# Patient Record
Sex: Male | Born: 1940 | Race: White | Hispanic: No | Marital: Married | Smoking: Current every day smoker
Health system: Southern US, Community
[De-identification: ages and names within clinical notes are randomized; demographics above are authoritative.]

## PROBLEM LIST (undated history)

## (undated) DIAGNOSIS — C679 Malignant neoplasm of bladder, unspecified: Secondary | ICD-10-CM

## (undated) DIAGNOSIS — J45909 Unspecified asthma, uncomplicated: Secondary | ICD-10-CM

## (undated) DIAGNOSIS — N403 Nodular prostate with lower urinary tract symptoms: Secondary | ICD-10-CM

## (undated) DIAGNOSIS — K219 Gastro-esophageal reflux disease without esophagitis: Secondary | ICD-10-CM

## (undated) DIAGNOSIS — C672 Malignant neoplasm of lateral wall of bladder: Secondary | ICD-10-CM

## (undated) DIAGNOSIS — G459 Transient cerebral ischemic attack, unspecified: Secondary | ICD-10-CM

## (undated) DIAGNOSIS — J439 Emphysema, unspecified: Secondary | ICD-10-CM

## (undated) DIAGNOSIS — G939 Disorder of brain, unspecified: Secondary | ICD-10-CM

## (undated) DIAGNOSIS — R338 Other retention of urine: Secondary | ICD-10-CM

## (undated) DIAGNOSIS — J449 Chronic obstructive pulmonary disease, unspecified: Secondary | ICD-10-CM

## (undated) DIAGNOSIS — E0590 Thyrotoxicosis, unspecified without thyrotoxic crisis or storm: Secondary | ICD-10-CM

## (undated) DIAGNOSIS — D494 Neoplasm of unspecified behavior of bladder: Secondary | ICD-10-CM

## (undated) DIAGNOSIS — I10 Essential (primary) hypertension: Secondary | ICD-10-CM

## (undated) HISTORY — DX: Essential (primary) hypertension: I10

## (undated) HISTORY — PX: BACK SURGERY: SHX140

## (undated) HISTORY — DX: Disorder of brain, unspecified: G93.9

## (undated) HISTORY — DX: Malignant neoplasm of lateral wall of bladder: C67.2

## (undated) HISTORY — DX: Chronic obstructive pulmonary disease, unspecified: J44.9

## (undated) HISTORY — DX: Nodular prostate with lower urinary tract symptoms: N40.3

## (undated) HISTORY — DX: Transient cerebral ischemic attack, unspecified: G45.9

## (undated) HISTORY — DX: Gastro-esophageal reflux disease without esophagitis: K21.9

## (undated) HISTORY — DX: Malignant neoplasm of bladder, unspecified: C67.9

## (undated) HISTORY — DX: Thyrotoxicosis, unspecified without thyrotoxic crisis or storm: E05.90

## (undated) HISTORY — DX: Unspecified asthma, uncomplicated: J45.909

## (undated) HISTORY — DX: Other retention of urine: R33.8

## (undated) HISTORY — DX: Emphysema, unspecified: J43.9

## (undated) SURGERY — BRONCHOSCOPY, FLEXIBLE
Anesthesia: IV Sedation (MBSC Only) | Laterality: Left

---

## 2004-08-02 ENCOUNTER — Ambulatory Visit: Payer: Self-pay | Admitting: Family Medicine

## 2005-04-11 ENCOUNTER — Ambulatory Visit: Payer: Self-pay | Admitting: Internal Medicine

## 2007-09-28 ENCOUNTER — Ambulatory Visit: Payer: Self-pay | Admitting: Unknown Physician Specialty

## 2007-10-07 ENCOUNTER — Ambulatory Visit: Payer: Self-pay | Admitting: Unknown Physician Specialty

## 2008-08-22 ENCOUNTER — Emergency Department (HOSPITAL_COMMUNITY): Admission: EM | Admit: 2008-08-22 | Discharge: 2008-08-22 | Payer: Self-pay | Admitting: Emergency Medicine

## 2009-08-07 ENCOUNTER — Ambulatory Visit: Payer: Self-pay | Admitting: Unknown Physician Specialty

## 2010-04-26 ENCOUNTER — Emergency Department (HOSPITAL_COMMUNITY)
Admission: EM | Admit: 2010-04-26 | Discharge: 2010-04-27 | Payer: Self-pay | Source: Home / Self Care | Admitting: Emergency Medicine

## 2010-04-28 HISTORY — PX: BLADDER SURGERY: SHX569

## 2010-07-08 ENCOUNTER — Ambulatory Visit: Payer: Self-pay

## 2010-07-08 LAB — BASIC METABOLIC PANEL
CO2: 28 mEq/L (ref 19–32)
Calcium: 9.9 mg/dL (ref 8.4–10.5)
Creatinine, Ser: 1.35 mg/dL (ref 0.4–1.5)
GFR calc Af Amer: >60 mL/min (ref >60)
GFR calc non Af Amer: 52 mL/min — ABNORMAL LOW (ref >60)
Sodium: 136 mEq/L (ref 135–145)

## 2010-07-08 LAB — DIFFERENTIAL
Basophils Relative: 2 % — ABNORMAL HIGH (ref 0–1)
Eosinophils Relative: 2 % (ref 0–5)
Lymphocytes Relative: 18 % (ref 12–46)
Lymphs Abs: 1.7 10*3/uL (ref 0.7–4.0)
Myelocytes: 0 %
Neutro Abs: 6.3 10*3/uL (ref 1.7–7.7)
Neutrophils Relative %: 66 % (ref 43–77)
Promyelocytes Absolute: 0 %
nRBC: 0 /100 WBC

## 2010-07-08 LAB — CBC
Hemoglobin: 12.9 g/dL — ABNORMAL LOW (ref 13.0–17.0)
MCH: 31.9 pg (ref 26.0–34.0)
Platelets: 252 10*3/uL (ref 150–400)
RBC: 4.05 MIL/uL — ABNORMAL LOW (ref 4.22–5.81)

## 2010-09-05 ENCOUNTER — Ambulatory Visit: Payer: Self-pay | Admitting: Urology

## 2010-09-09 ENCOUNTER — Ambulatory Visit: Payer: Self-pay | Admitting: Urology

## 2011-04-15 ENCOUNTER — Ambulatory Visit: Payer: Self-pay | Admitting: Urology

## 2011-04-16 LAB — PATHOLOGY REPORT

## 2012-01-12 ENCOUNTER — Ambulatory Visit: Payer: Self-pay | Admitting: Family Medicine

## 2012-03-17 DIAGNOSIS — C679 Malignant neoplasm of bladder, unspecified: Secondary | ICD-10-CM | POA: Insufficient documentation

## 2012-03-17 DIAGNOSIS — C672 Malignant neoplasm of lateral wall of bladder: Secondary | ICD-10-CM | POA: Insufficient documentation

## 2012-03-17 DIAGNOSIS — R339 Retention of urine, unspecified: Secondary | ICD-10-CM | POA: Insufficient documentation

## 2013-12-14 DIAGNOSIS — N183 Chronic kidney disease, stage 3 unspecified: Secondary | ICD-10-CM | POA: Insufficient documentation

## 2013-12-23 ENCOUNTER — Ambulatory Visit: Payer: Self-pay | Admitting: Vascular Surgery

## 2014-07-10 ENCOUNTER — Ambulatory Visit: Payer: Self-pay | Admitting: Urology

## 2014-07-17 ENCOUNTER — Ambulatory Visit: Payer: Self-pay | Admitting: Urology

## 2014-07-29 ENCOUNTER — Other Ambulatory Visit: Payer: Self-pay

## 2014-07-29 DIAGNOSIS — I6782 Cerebral ischemia: Secondary | ICD-10-CM | POA: Insufficient documentation

## 2014-07-29 DIAGNOSIS — C679 Malignant neoplasm of bladder, unspecified: Secondary | ICD-10-CM | POA: Insufficient documentation

## 2014-07-29 DIAGNOSIS — N401 Enlarged prostate with lower urinary tract symptoms: Secondary | ICD-10-CM

## 2014-07-29 DIAGNOSIS — N138 Other obstructive and reflux uropathy: Secondary | ICD-10-CM | POA: Insufficient documentation

## 2014-07-29 DIAGNOSIS — M1990 Unspecified osteoarthritis, unspecified site: Secondary | ICD-10-CM | POA: Insufficient documentation

## 2014-07-29 DIAGNOSIS — J45909 Unspecified asthma, uncomplicated: Secondary | ICD-10-CM | POA: Insufficient documentation

## 2014-07-29 DIAGNOSIS — K219 Gastro-esophageal reflux disease without esophagitis: Secondary | ICD-10-CM | POA: Insufficient documentation

## 2014-07-29 DIAGNOSIS — C672 Malignant neoplasm of lateral wall of bladder: Secondary | ICD-10-CM | POA: Insufficient documentation

## 2014-07-29 DIAGNOSIS — G939 Disorder of brain, unspecified: Secondary | ICD-10-CM | POA: Insufficient documentation

## 2014-07-29 DIAGNOSIS — E0590 Thyrotoxicosis, unspecified without thyrotoxic crisis or storm: Secondary | ICD-10-CM | POA: Insufficient documentation

## 2014-07-29 DIAGNOSIS — E785 Hyperlipidemia, unspecified: Secondary | ICD-10-CM | POA: Insufficient documentation

## 2014-07-29 HISTORY — DX: Cerebral ischemia: I67.82

## 2014-07-29 HISTORY — DX: Unspecified asthma, uncomplicated: J45.909

## 2014-08-21 LAB — SURGICAL PATHOLOGY

## 2014-08-27 NOTE — Op Note (Signed)
PATIENT NAME:  Bruce May, Bruce May MR#:  [id number] DATE OF BIRTH:  [birth year]  PREOPERATIVE DIAGNOSIS: Recurrent bladder tumor.   POSTOPERATIVE DIAGNOSIS: Recurrent bladder tumor.   PROCEDURE: Cystoscopy with biopsy of small bladder tumor with fulguration.   ANESTHESIA:  General.  ANESTHESIA: Timeout taken preoperatively pre-instrumentation.  With the patient sterilely prepped and draped in supine lithotomy position and after an appropriate timeout, we began the procedure.  He has had preop antibiotics in the form of Cipro. He had good relaxation from general anesthetic. Cystoscopy revealed a normal urethra throughout its length with possibly 1 large bulbous urethral stricture that is easily bypassed with a 22 French sheath. Prostate is mildly enlarged first in the middle lobe. On view of the bladder, there was minimal trabeculation, no saccule, no stones. There was a small tiny probably recurrent tumor on the left posterolateral wall which is biopsied singly with flexible biopsy forceps, fulgurated with a 5 [place] Bugbee electrode. No bleeding at the end of the procedure. Rectal exam for placement of the B and O suppository is negative for tumor, mass, or growth. The bladder itself has 30 mL of 0.5% Marcaine placed in it. There is no bleeding at the end of the procedure. He is sent to recovery in satisfactory condition.    ____________________________ Janice Coffin. Elnoria Howard, Tangent rdh:sp D: 07/17/2014 13:53:21 ET T: 07/17/2014 14:05:11 ET JOB#: [id number]  cc: Janice Coffin. Elnoria Howard, DO, <Dictator> Shawntee Mainwaring D Bonnye Halle DO ELECTRONICALLY SIGNED 08/07/2014 12:57

## 2014-09-12 ENCOUNTER — Encounter: Payer: Self-pay | Admitting: *Deleted

## 2014-09-28 ENCOUNTER — Ambulatory Visit (INDEPENDENT_AMBULATORY_CARE_PROVIDER_SITE_OTHER): Payer: Medicare Other

## 2014-09-28 VITALS — BP 164/76 | HR 73 | Ht 68.0 in | Wt 153.7 lb

## 2014-09-28 DIAGNOSIS — C672 Malignant neoplasm of lateral wall of bladder: Secondary | ICD-10-CM

## 2014-09-28 LAB — URINALYSIS, COMPLETE
BILIRUBIN UA: NEGATIVE
GLUCOSE, UA: NEGATIVE
KETONES UA: NEGATIVE
LEUKOCYTES UA: NEGATIVE
NITRITE UA: NEGATIVE
Specific Gravity, UA: 1.025 (ref 1.005–1.030)
Urobilinogen, Ur: 1 mg/dL (ref 0.2–1.0)
pH, UA: 5.5 (ref 5.0–7.5)

## 2014-09-28 LAB — MICROSCOPIC EXAMINATION: BACTERIA UA: NONE SEEN

## 2014-09-28 MED ORDER — BCG LIVE 81 MG/VIAL IS SUSR: 81.0000 mg | Freq: Once | INTRAVESICAL | Status: DC

## 2014-09-28 MED ORDER — LIDOCAINE HCL 2 % EX GEL
1.0000 "application " | Freq: Once | CUTANEOUS | Status: AC
  Administered 2014-09-28: 1 via URETHRAL

## 2014-09-28 NOTE — Progress Notes (Signed)
BCG Bladder Instillation  BCG # 5  Due to Bladder Cancer patient is present today for a BCG treatment. Patient was cleaned and prepped in a sterile fashion with betadine and lidocaine 2% jelly was instilled into the urethra.  A 14FR catheter was inserted, urine return was noted 61m, urine was yellow in color.  530mof reconstituted BCG was instilled into the bladder. The catheter was then removed. Patient tolerated well, no complications were noted  Preformed by: ChToniann FailLPN  Follow up/ Additional notes: f/u 1 week for final BCG

## 2014-10-02 MED ORDER — BCG LIVE 81 MG/VIAL IS SUSR: 81.0000 mg | Freq: Once | INTRAVESICAL | Status: DC

## 2014-10-04 ENCOUNTER — Ambulatory Visit: Payer: Self-pay | Admitting: Urology

## 2014-10-05 ENCOUNTER — Encounter: Payer: Self-pay | Admitting: Urology

## 2014-10-05 ENCOUNTER — Ambulatory Visit (INDEPENDENT_AMBULATORY_CARE_PROVIDER_SITE_OTHER): Payer: Medicare Other | Admitting: Urology

## 2014-10-05 VITALS — BP 163/78 | HR 81 | Ht 68.0 in | Wt 153.3 lb

## 2014-10-05 DIAGNOSIS — D090 Carcinoma in situ of bladder: Secondary | ICD-10-CM

## 2014-10-05 LAB — URINALYSIS, COMPLETE
BILIRUBIN UA: NEGATIVE
GLUCOSE, UA: NEGATIVE
Ketones, UA: NEGATIVE
LEUKOCYTES UA: NEGATIVE
Nitrite, UA: NEGATIVE
RBC UA: NEGATIVE
SPEC GRAV UA: 1.025 (ref 1.005–1.030)
UUROB: 0.2 mg/dL (ref 0.2–1.0)
pH, UA: 5 (ref 5.0–7.5)

## 2014-10-05 LAB — MICROSCOPIC EXAMINATION: BACTERIA UA: NONE SEEN

## 2014-10-05 MED ORDER — LIDOCAINE HCL 2 % EX GEL
1.0000 "application " | Freq: Once | CUTANEOUS | Status: AC
  Administered 2014-10-05: 1 via URETHRAL

## 2014-10-05 MED ORDER — SODIUM CHLORIDE 0.9 % IJ SOLN
50.0000 mL | Freq: Once | INTRAMUSCULAR | Status: AC
  Administered 2014-10-05: 50 mL

## 2014-10-05 MED ORDER — BCG LIVE 50 MG IS SUSR: 3.2400 mL | Freq: Once | INTRAVESICAL | Status: DC

## 2014-10-05 MED ORDER — BCG LIVE 50 MG IS SUSR
3.2400 mL | Freq: Once | INTRAVESICAL | Status: AC
  Administered 2014-10-05: 81 mg via INTRAVESICAL

## 2014-10-05 MED ORDER — SODIUM CHLORIDE 0.9 % IJ SOLN: 50.0000 mL | Freq: Once | INTRAMUSCULAR | Status: DC

## 2014-10-05 NOTE — Progress Notes (Signed)
BCG Bladder Instillation  BCG # 6  Due to Bladder Cancer patient is present today for a BCG treatment. Patient was cleaned and prepped in a sterile fashion with betadine and lidocaine 2% jelly was instilled into the urethra.  A 16FR coude catheter was inserted, urine return was noted 46m, urine was Lt. yellow in color.  532mof reconstituted BCG was instilled into the bladder. The catheter was then removed. Patient tolerated well, no complications were noted.Preformed by: RaLyndee HensenMA  Follow up/ Additional notes: 3 months for cystoscopy .

## 2014-10-07 NOTE — Progress Notes (Signed)
10/05/2014 9:05 PM   Bruce May 21-Aug-1940 [id number]  Referring provider: No referring provider defined for this encounter.  Chief Complaint  Patient presents with  . Bladder Cancer    6th BCG treatment    HPI: Bruce May is a 74 year old white male who presents today for his 6th BCG treatment. His UA is unremarkable today. He did not have any difficulty with his last instillation. He was able to hold it for the full 2 hours. He denied any hematuria after the treatment.  He denies any fevers, chills, nausea, vomiting or cough. He has no urinary symptoms at this time.  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 Jacqlyn May.  Maintenance cysto 06/29/14 by Dr. Elnoria May revealed small suspicious area. TURBT was scheduled. Pathology report below.  Surgical Pathology Report DIAGNOSIS: A. BLADDER; BIOPSY: - UROTHELIAL CARCINOMA IN SITU. GROSS DESCRIPTION: A. Labeled: Recurrent bladder tumor Tissue Fragment(s): 1 Measurement: 0.3 cm Comment: Pink, marked blue 07/19/2014 2:54:46PM     PMH: Past Medical History  Diagnosis Date  . Malignant neoplasm of bladder   . Hyperthyroidism   . Nodular prostate with urinary retention   . TIA (transient ischemic attack)   . Acid reflux   . Asthma   . Hypertension   . Malignant neoplasm of lateral wall of urinary bladder     Surgical History: Past Surgical History  Procedure Laterality Date  . Bladder surgery  2012  . Back surgery      Home Medications:    Medication List       This list is accurate as of: 10/05/14 11:59 PM.  Always use your most recent med list.               albuterol 108 (90 BASE) MCG/ACT inhaler  Commonly known as:  PROVENTIL HFA;VENTOLIN HFA  Inhale into the lungs.     aspirin 120 MG suppository  Take by mouth.     aspirin 325 MG EC tablet  Take 325 mg by mouth daily.     calcium carbonate 1250 MG capsule  Take by mouth.     cetirizine 10 MG tablet  Commonly known as:   ZYRTEC  Take 10 mg by mouth daily.     fenofibrate 160 MG tablet  Take by mouth.     finasteride 5 MG tablet  Commonly known as:  PROSCAR  Take by mouth.     FISH OIL EXTRA STRENGTH 1200 MG Caps  Take by mouth.     Fluticasone-Salmeterol 250-50 MCG/DOSE Aepb  Commonly known as:  ADVAIR  Inhale into the lungs.     levothyroxine 112 MCG tablet  Commonly known as:  SYNTHROID, LEVOTHROID  Take by mouth.     MULTI VITAMIN DAILY Tabs  Take by mouth.     omeprazole 40 MG capsule  Commonly known as:  PRILOSEC  Take by mouth.     pravastatin 80 MG tablet  Commonly known as:  PRAVACHOL  Take by mouth.     tiotropium 18 MCG inhalation capsule  Commonly known as:  Crisman into inhaler and inhale.        Allergies: No Known Allergies  Family History: Family History  Problem Relation Age of Onset  . Kidney disease Sister   . Diabetes Sister     Social History:  reports that he has been smoking.  He does not have any smokeless tobacco history on file. He reports that he does not drink alcohol  or use illicit drugs.  ROS: Urological Symptom Review  Patient is experiencing the following symptoms: Frequent urination Get up at night to urinate   Review of Systems  Gastrointestinal (upper)  : Indigestion/heartburn  Gastrointestinal (lower) : Negative for lower GI symptoms  Constitutional : Negative for symptoms  Skin: Negative for skin symptoms  Eyes: Negative for eye symptoms  Ear/Nose/Throat : Negative for Ear/Nose/Throat symptoms  Hematologic/Lymphatic: Negative for Hematologic/Lymphatic symptoms  Cardiovascular : Negative for cardiovascular symptoms  Respiratory : Cough  Endocrine: Negative for endocrine symptoms  Musculoskeletal: Back pain  Neurological: Negative for neurological symptoms  Psychologic: Negative for psychiatric symptoms   Physical Exam: BP 163/78 mmHg  Pulse 81  Ht '5\' 8"'$  (1.727 m)  Wt 153 lb 4.8 oz (69.536  kg)  BMI 23.31 kg/m2   Laboratory Data: Lab Results  Component Value Date   WBC 9.5 04/27/2010   HGB 12.9* 04/27/2010   HCT 36.8* 04/27/2010   MCV 90.9 04/27/2010   PLT 252 04/27/2010    Lab Results  Component Value Date   CREATININE 1.35 04/27/2010    No results found for: PSA  No results found for: TESTOSTERONE  No results found for: HGBA1C  Urinalysis Results for orders placed or performed in visit on 10/05/14  Microscopic Examination  Result Value Ref Range   WBC, UA 0-5 0 -  5 /hpf   RBC, UA 0-2 0 -  2 /hpf   Epithelial Cells (non renal) 0-10 0 - 10 /hpf   Bacteria, UA None seen None seen/Few  Urinalysis, Complete  Result Value Ref Range   Specific Gravity, UA 1.025 1.005 - 1.030   pH, UA 5.0 5.0 - 7.5   Color, UA Yellow Yellow   Appearance Ur Clear Clear   Leukocytes, UA Negative Negative   Protein, UA 2+ (A) Negative/Trace   Glucose, UA Negative Negative   Ketones, UA Negative Negative   RBC, UA Negative Negative   Bilirubin, UA Negative Negative   Urobilinogen, Ur 0.2 0.2 - 1.0 mg/dL   Nitrite, UA Negative Negative   Microscopic Examination See below:        Component Value Date/Time   GLUCOSEU Negative 10/05/2014 0923   BILIRUBINUR Negative 10/05/2014 0923   NITRITE Negative 10/05/2014 0923   LEUKOCYTESUR Negative 10/05/2014 0923    Pertinent Imaging:   Assessment & Plan:    1. CIS of bladder-  Patient has completed his sixth BCG today. He will return in 3 months time for surveillance cystoscopy.  - Urinalysis, Complete - lidocaine (XYLOCAINE) 2 % jelly 1 application; Place 1 application into the urethra once. - bcg vaccine injection 81 mg; Instill 3.24 mLs (81 mg total) into the bladder once. - sodium chloride 0.9 % injection 50 mL; 50 mLs by Intracatheter route once.   Return in about 3 months (around 01/05/2015) for Cystoscopy.  Bruce May, [facility] [address], [place], [place]  [address] [phone]

## 2014-10-11 DIAGNOSIS — J449 Chronic obstructive pulmonary disease, unspecified: Secondary | ICD-10-CM | POA: Insufficient documentation

## 2014-10-11 DIAGNOSIS — R030 Elevated blood-pressure reading, without diagnosis of hypertension: Secondary | ICD-10-CM

## 2014-10-11 DIAGNOSIS — IMO0001 Reserved for inherently not codable concepts without codable children: Secondary | ICD-10-CM | POA: Insufficient documentation

## 2014-10-11 DIAGNOSIS — L989 Disorder of the skin and subcutaneous tissue, unspecified: Secondary | ICD-10-CM | POA: Insufficient documentation

## 2014-10-11 DIAGNOSIS — N400 Enlarged prostate without lower urinary tract symptoms: Secondary | ICD-10-CM | POA: Insufficient documentation

## 2014-10-11 DIAGNOSIS — Z8551 Personal history of malignant neoplasm of bladder: Secondary | ICD-10-CM | POA: Insufficient documentation

## 2014-10-11 DIAGNOSIS — R5383 Other fatigue: Secondary | ICD-10-CM | POA: Insufficient documentation

## 2014-10-11 DIAGNOSIS — Z8673 Personal history of transient ischemic attack (TIA), and cerebral infarction without residual deficits: Secondary | ICD-10-CM | POA: Insufficient documentation

## 2014-10-11 DIAGNOSIS — E039 Hypothyroidism, unspecified: Secondary | ICD-10-CM | POA: Insufficient documentation

## 2014-10-11 DIAGNOSIS — R0989 Other specified symptoms and signs involving the circulatory and respiratory systems: Secondary | ICD-10-CM | POA: Insufficient documentation

## 2014-10-11 DIAGNOSIS — J439 Emphysema, unspecified: Secondary | ICD-10-CM

## 2014-10-11 DIAGNOSIS — E7800 Pure hypercholesterolemia, unspecified: Secondary | ICD-10-CM | POA: Insufficient documentation

## 2014-10-11 HISTORY — DX: Chronic obstructive pulmonary disease, unspecified: J44.9

## 2014-10-11 HISTORY — DX: Emphysema, unspecified: J43.9

## 2014-12-07 ENCOUNTER — Ambulatory Visit (INDEPENDENT_AMBULATORY_CARE_PROVIDER_SITE_OTHER): Payer: Medicare Other | Admitting: Family Medicine

## 2014-12-07 ENCOUNTER — Encounter: Payer: Self-pay | Admitting: Family Medicine

## 2014-12-07 ENCOUNTER — Other Ambulatory Visit: Payer: Self-pay

## 2014-12-07 VITALS — BP 140/78 | HR 76 | Temp 97.9°F | Resp 16 | Wt 151.6 lb

## 2014-12-07 DIAGNOSIS — R0989 Other specified symptoms and signs involving the circulatory and respiratory systems: Secondary | ICD-10-CM | POA: Diagnosis not present

## 2014-12-07 DIAGNOSIS — N183 Chronic kidney disease, stage 3 unspecified: Secondary | ICD-10-CM

## 2014-12-07 DIAGNOSIS — E7800 Pure hypercholesterolemia, unspecified: Secondary | ICD-10-CM

## 2014-12-07 DIAGNOSIS — E786 Lipoprotein deficiency: Secondary | ICD-10-CM | POA: Diagnosis not present

## 2014-12-07 DIAGNOSIS — R030 Elevated blood-pressure reading, without diagnosis of hypertension: Secondary | ICD-10-CM

## 2014-12-07 DIAGNOSIS — N400 Enlarged prostate without lower urinary tract symptoms: Secondary | ICD-10-CM

## 2014-12-07 DIAGNOSIS — IMO0001 Reserved for inherently not codable concepts without codable children: Secondary | ICD-10-CM

## 2014-12-07 DIAGNOSIS — E039 Hypothyroidism, unspecified: Secondary | ICD-10-CM | POA: Insufficient documentation

## 2014-12-07 DIAGNOSIS — E038 Other specified hypothyroidism: Secondary | ICD-10-CM | POA: Diagnosis not present

## 2014-12-07 MED ORDER — FINASTERIDE 5 MG PO TABS: 5.0000 mg | ORAL_TABLET | Freq: Every day | ORAL | Status: AC

## 2014-12-07 NOTE — Progress Notes (Signed)
Patient: Bruce May Male    DOB: [birth year]   74 y.o.   MRN: [id number] Visit Date: 12/07/2014  Today's Provider: Vernie Murders, PA   Chief Complaint  Patient presents with  . Follow-up    Hypothyrodism, Elevated blood pressure, hypercholesteromia   Subjective:    Hypertension Pertinent negatives include no chest pain, palpitations or shortness of breath.  Hyperlipidemia Pertinent negatives include no chest pain or shortness of breath.   Past Medical History  Diagnosis Date  . Malignant neoplasm of bladder   . Hyperthyroidism   . Nodular prostate with urinary retention   . TIA (transient ischemic attack)   . Acid reflux   . Asthma   . Hypertension   . Malignant neoplasm of lateral wall of urinary bladder    Past Surgical History  Procedure Laterality Date  . Bladder surgery  2012  . Back surgery     Family History  Problem Relation Age of Onset  . Kidney disease Sister   . Diabetes Sister      No Known Allergies Previous Medications   ALBUTEROL (PROVENTIL HFA;VENTOLIN HFA) 108 (90 BASE) MCG/ACT INHALER    Inhale into the lungs.   ASPIRIN 325 MG EC TABLET    Take 325 mg by mouth daily.   CALCIUM CARBONATE 1250 MG CAPSULE    Take by mouth.   CETIRIZINE (ZYRTEC) 10 MG TABLET    Take 10 mg by mouth daily.   FENOFIBRATE 160 MG TABLET    Take by mouth.   FINASTERIDE (PROSCAR) 5 MG TABLET    Take by mouth.   FLUTICASONE-SALMETEROL (ADVAIR) 250-50 MCG/DOSE AEPB    Inhale into the lungs.   LEVOTHYROXINE (SYNTHROID, LEVOTHROID) 112 MCG TABLET    Take by mouth.   MULTIPLE VITAMIN (MULTI VITAMIN DAILY) TABS    Take by mouth.   OMEGA-3 FATTY ACIDS (FISH OIL EXTRA STRENGTH) 1200 MG CAPS    Take by mouth.   OMEPRAZOLE (PRILOSEC) 40 MG CAPSULE    Take by mouth.   PRAVASTATIN (PRAVACHOL) 80 MG TABLET    Take by mouth.   TIOTROPIUM (SPIRIVA) 18 MCG INHALATION CAPSULE    Place into inhaler and inhale.    Review of Systems  Constitutional: Negative.  Negative for  fatigue.  HENT: Negative.   Eyes: Negative.   Respiratory: Positive for wheezing. Negative for shortness of breath.   Cardiovascular: Negative.  Negative for chest pain and palpitations.  Gastrointestinal: Negative.   Endocrine: Negative.   Genitourinary: Negative.   Musculoskeletal: Negative.   Skin: Negative.   Allergic/Immunologic: Negative.   Neurological: Negative.   Hematological: Negative.   Psychiatric/Behavioral: Negative.     Social History  Substance Use Topics  . Smoking status: Current Every Day Smoker  . Smokeless tobacco: Not on file  . Alcohol Use: No   Objective:   BP 140/78 mmHg  Pulse 76  Temp(Src) 97.9 F (36.6 C) (Oral)  Resp 16  Wt 151 lb 9.6 oz (68.765 kg)  Physical Exam  Constitutional: He is oriented to person, place, and time. He appears well-developed and well-nourished. No distress.  HENT:  Head: Normocephalic and atraumatic.  Right Ear: Hearing normal.  Left Ear: Hearing normal.  Nose: Nose normal.  Mouth/Throat: Oropharynx is clear and moist.  Wearing hearing aids bilaterally. TM's with good reflex and no cerumen accumulation.  Eyes: Conjunctivae, EOM and lids are normal. Right eye exhibits no discharge. Left eye exhibits no discharge. No scleral icterus.  Neck: Normal range of motion. Neck supple.  Cardiovascular: Normal rate and regular rhythm.   Right carotid bruit followed by vascular specialist (Dr. Delana Meyer annually).  Pulmonary/Chest: Effort normal. No respiratory distress.  Distant breath sounds of COPD  Abdominal: Soft. Bowel sounds are normal.  Musculoskeletal: Normal range of motion.  Neurological: He is alert and oriented to person, place, and time.  Skin: Skin is intact. No lesion and no rash noted.  Psychiatric: He has a normal mood and affect. His speech is normal and behavior is normal. Thought content normal.      Assessment & Plan:     1. Blood pressure elevated Stable and to adversely elevated today. Will recheck  routine labs. - CBC with Differential/Platelet  2. Hypercholesterolemia without hypertriglyceridemia Tolerating Pravastatin and Omega-3 supplement without side effects. Will recheck lipids. - Lipid panel  3. Other specified hypothyroidism Still some decrease in energy in the mornings but better as he works throughout the day. Still on Levothyroxine 100 mcg qd. Will recheck TSH. - TSH  4. Chronic kidney disease (CKD), stage III (moderate) Followed by nephrologist regularly. Recheck CMP. - Comprehensive metabolic panel  5. Low HDL (under 40) Triglycerides and LDL was in good shape in April 2016. Continue present medications and recheck lipids.  6. Bruit of right carotid artery Followed annually by Dr. Delana Meyer (vascular specialist). Pulmonologist, vascular specialist and this office constantly counsels him on stopping his smoking > 1 ppd. Still refuses despite bruit and COPD.       Vernie Murders, PA  [facility]

## 2014-12-08 LAB — LIPID PANEL
CHOL/HDL RATIO: 5.1 ratio — AB (ref 0.0–5.0)
Cholesterol, Total: 154 mg/dL (ref 100–199)
HDL: 30 mg/dL — AB (ref >39)
LDL Calculated: 97 mg/dL (ref 0–99)
Triglycerides: 134 mg/dL (ref 0–149)
VLDL CHOLESTEROL CAL: 27 mg/dL (ref 5–40)

## 2014-12-08 LAB — CBC WITH DIFFERENTIAL/PLATELET
BASOS: 1 %
Basophils Absolute: 0 10*3/uL (ref 0.0–0.2)
EOS (ABSOLUTE): 0.3 10*3/uL (ref 0.0–0.4)
EOS: 4 %
HEMOGLOBIN: 12.4 g/dL — AB (ref 12.6–17.7)
Hematocrit: 37.6 % (ref 37.5–51.0)
IMMATURE GRANS (ABS): 0 10*3/uL (ref 0.0–0.1)
IMMATURE GRANULOCYTES: 0 %
Lymphocytes Absolute: 2.3 10*3/uL (ref 0.7–3.1)
Lymphs: 33 %
MCH: 30.7 pg (ref 26.6–33.0)
MCHC: 33 g/dL (ref 31.5–35.7)
MCV: 93 fL (ref 79–97)
Monocytes Absolute: 0.7 10*3/uL (ref 0.1–0.9)
Monocytes: 10 %
NEUTROS PCT: 52 %
Neutrophils Absolute: 3.5 10*3/uL (ref 1.4–7.0)
Platelets: 323 10*3/uL (ref 150–379)
RBC: 4.04 x10E6/uL — AB (ref 4.14–5.80)
RDW: 13.4 % (ref 12.3–15.4)
WBC: 6.8 10*3/uL (ref 3.4–10.8)

## 2014-12-08 LAB — COMPREHENSIVE METABOLIC PANEL
A/G RATIO: 1.8 (ref 1.1–2.5)
ALT: 14 IU/L (ref 0–44)
AST: 27 IU/L (ref 0–40)
Albumin: 4.2 g/dL (ref 3.5–4.8)
Alkaline Phosphatase: 48 IU/L (ref 39–117)
BUN / CREAT RATIO: 20 (ref 10–22)
BUN: 25 mg/dL (ref 8–27)
Bilirubin Total: 0.5 mg/dL (ref 0.0–1.2)
CHLORIDE: 103 mmol/L (ref 97–108)
CO2: 22 mmol/L (ref 18–29)
Calcium: 9.1 mg/dL (ref 8.6–10.2)
Creatinine, Ser: 1.25 mg/dL (ref 0.76–1.27)
GFR calc non Af Amer: 56 mL/min/{1.73_m2} — ABNORMAL LOW (ref >59)
GFR, EST AFRICAN AMERICAN: 65 mL/min/{1.73_m2} (ref >59)
Globulin, Total: 2.4 g/dL (ref 1.5–4.5)
Glucose: 114 mg/dL — ABNORMAL HIGH (ref 65–99)
POTASSIUM: 5.1 mmol/L (ref 3.5–5.2)
SODIUM: 142 mmol/L (ref 134–144)
Total Protein: 6.6 g/dL (ref 6.0–8.5)

## 2014-12-08 LAB — TSH: TSH: 0.132 u[IU]/mL — ABNORMAL LOW (ref 0.450–4.500)

## 2014-12-12 ENCOUNTER — Telehealth: Payer: Self-pay

## 2014-12-12 NOTE — Telephone Encounter (Signed)
Margaretha Sheffield was Advised as directed below.  Thanks,  -Rockwell Zentz

## 2014-12-12 NOTE — Telephone Encounter (Signed)
-----   Message from Margo Common, [place] sent at 12/08/2014  6:26 PM EDT ----- Thyroid test still stable. Continue Levothyroxine 100 mcg qd. Blood sugar better than 4 years ago - not diabetic. Cholesterol and triglycerides normal except HDL ("the good cholesterol") is low. Continue present medication and should stop smoking. Recheck in 6 months.

## 2015-01-04 ENCOUNTER — Other Ambulatory Visit: Payer: Medicare Other | Admitting: Urology

## 2015-01-04 ENCOUNTER — Encounter: Payer: Self-pay | Admitting: Family Medicine

## 2015-01-04 ENCOUNTER — Ambulatory Visit (INDEPENDENT_AMBULATORY_CARE_PROVIDER_SITE_OTHER): Payer: Medicare Other | Admitting: Family Medicine

## 2015-01-04 VITALS — BP 138/56 | HR 88 | Temp 98.3°F | Resp 14 | Wt 151.0 lb

## 2015-01-04 DIAGNOSIS — J069 Acute upper respiratory infection, unspecified: Secondary | ICD-10-CM | POA: Diagnosis not present

## 2015-01-04 DIAGNOSIS — J441 Chronic obstructive pulmonary disease with (acute) exacerbation: Secondary | ICD-10-CM

## 2015-01-04 MED ORDER — PROMETHAZINE-DM 6.25-15 MG/5ML PO SYRP: 5.0000 mL | ORAL_SOLUTION | Freq: Four times a day (QID) | ORAL | Status: DC | PRN

## 2015-01-04 NOTE — Progress Notes (Signed)
Patient ID: Bruce May, male   DOB: 1940/11/12, 74 y.o.   MRN: [id number]    Patient: Bruce May Male    DOB: [birth year]   74 y.o.   MRN: [id number] Visit Date: 01/04/2015  Today's Provider: Vernie Murders, PA   Chief Complaint  Patient presents with  . URI    X 1 week   Subjective:    URI  This is a new problem. The current episode started in the past 7 days. The problem has been gradually worsening. There has been no fever. Associated symptoms include congestion, coughing and wheezing. He has tried antihistamine and decongestant for the symptoms. The treatment provided no relief.   Past Medical History  Diagnosis Date  . Malignant neoplasm of bladder   . Hyperthyroidism   . Nodular prostate with urinary retention   . TIA (transient ischemic attack)   . Acid reflux   . Asthma   . Hypertension   . Malignant neoplasm of lateral wall of urinary bladder    Family History  Problem Relation Age of Onset  . Kidney disease Sister   . Diabetes Sister    Past Surgical History  Procedure Laterality Date  . Bladder surgery  2012  . Back surgery     No Known Allergies    Previous Medications   ALBUTEROL (PROVENTIL HFA;VENTOLIN HFA) 108 (90 BASE) MCG/ACT INHALER    Inhale into the lungs.   ASPIRIN 325 MG EC TABLET    Take 325 mg by mouth daily.   CALCIUM CARBONATE 1250 MG CAPSULE    Take by mouth.   CETIRIZINE (ZYRTEC) 10 MG TABLET    Take 10 mg by mouth daily.   FENOFIBRATE 160 MG TABLET    Take by mouth.   FINASTERIDE (PROSCAR) 5 MG TABLET    Take 1 tablet (5 mg total) by mouth daily.   FLUTICASONE-SALMETEROL (ADVAIR) 250-50 MCG/DOSE AEPB    Inhale into the lungs.   LEVOTHYROXINE (SYNTHROID, LEVOTHROID) 112 MCG TABLET    Take by mouth.   MULTIPLE VITAMIN (MULTI VITAMIN DAILY) TABS    Take by mouth.   OMEGA-3 FATTY ACIDS (FISH OIL EXTRA STRENGTH) 1200 MG CAPS    Take by mouth.   OMEPRAZOLE (PRILOSEC) 40 MG CAPSULE    Take by mouth.   PRAVASTATIN (PRAVACHOL) 80 MG  TABLET    Take by mouth.   TIOTROPIUM (SPIRIVA) 18 MCG INHALATION CAPSULE    Place into inhaler and inhale.    Review of Systems  Constitutional: Negative.   HENT: Positive for congestion.   Eyes: Negative.   Respiratory: Positive for cough and wheezing.   Cardiovascular: Negative.   Gastrointestinal: Negative.   Endocrine: Negative.   Genitourinary: Negative.   Musculoskeletal: Negative.   Skin: Negative.   Allergic/Immunologic: Negative.   Neurological: Negative.   Psychiatric/Behavioral: Negative.     Social History  Substance Use Topics  . Smoking status: Current Every Day Smoker  . Smokeless tobacco: Not on file  . Alcohol Use: No   Objective:   BP 138/56 mmHg  Pulse 88  Temp(Src) 98.3 F (36.8 C) (Oral)  Resp 14  Wt 151 lb (68.493 kg)  SpO2 94%  Physical Exam  Constitutional: He is oriented to person, place, and time. He appears well-developed and well-nourished.  HENT:  Head: Normocephalic and atraumatic.  Wearing bilateral hearing aids for hearing loss - continues to work on Office [employer].  Eyes: Conjunctivae and EOM are normal. Pupils are equal, round, and reactive  to light.  Neck: Normal range of motion. Neck supple.  Cardiovascular: Normal rate and regular rhythm.   Pulmonary/Chest: Effort normal. He has wheezes. He has no rales.  Distant breath sounds. Occasional wheeze in the evening.  Abdominal: Soft. Bowel sounds are normal.  Lymphadenopathy:    He has no cervical adenopathy.  Neurological: He is alert and oriented to person, place, and time.  Skin: No rash noted.  Psychiatric: He has a normal mood and affect. His behavior is normal.      Assessment & Plan:     1. Upper respiratory infection Recent onset of cough with slight congestion. No sore throat, earache or fever. Increase fluid intake and add cough syrup. - promethazine-dextromethorphan (PROMETHAZINE-DM) 6.25-15 MG/5ML syrup; Take 5 mLs by mouth 4 (four) times daily as needed for  cough.  Dispense: 118 mL; Refill: 0  2. COPD with acute exacerbation States wheezing and some dyspnea starts with colds. Encouraged to take Spiriva and Advair daily. May increase use of ProAir-HFA to QID as needed. Still smoking 1+ppd (states he breaths better if he switches to menthol cigarettes when a cold hits). Recheck if sputum production, fever or worsening dyspnea develops. Refuses to quit smoking.

## 2015-01-05 ENCOUNTER — Other Ambulatory Visit: Payer: Self-pay | Admitting: Urology

## 2015-01-30 ENCOUNTER — Ambulatory Visit (INDEPENDENT_AMBULATORY_CARE_PROVIDER_SITE_OTHER): Payer: Medicare Other | Admitting: Urology

## 2015-01-30 ENCOUNTER — Encounter: Payer: Self-pay | Admitting: Urology

## 2015-01-30 VITALS — BP 125/65 | HR 80 | Temp 98.2°F | Ht 68.0 in | Wt 148.1 lb

## 2015-01-30 DIAGNOSIS — C679 Malignant neoplasm of bladder, unspecified: Secondary | ICD-10-CM

## 2015-01-30 LAB — MICROSCOPIC EXAMINATION
Bacteria, UA: NONE SEEN
RBC, UA: NONE SEEN /HPF
WBC, UA: NONE SEEN /HPF

## 2015-01-30 LAB — URINALYSIS, COMPLETE
Bilirubin, UA: NEGATIVE
GLUCOSE, UA: NEGATIVE
KETONES UA: NEGATIVE
Leukocytes, UA: NEGATIVE
NITRITE UA: NEGATIVE
RBC, UA: NEGATIVE
Specific Gravity, UA: 1.025 (ref 1.005–1.030)
UUROB: 0.2 mg/dL (ref 0.2–1.0)
pH, UA: 5.5 (ref 5.0–7.5)

## 2015-01-30 MED ORDER — CIPROFLOXACIN HCL 500 MG PO TABS
500.0000 mg | ORAL_TABLET | Freq: Once | ORAL | Status: AC
  Administered 2015-01-30: 500 mg via ORAL

## 2015-01-30 MED ORDER — LIDOCAINE HCL 2 % EX GEL
1.0000 "application " | Freq: Once | CUTANEOUS | Status: AC
  Administered 2015-01-30: 1 via URETHRAL

## 2015-01-30 NOTE — Progress Notes (Signed)
    Cystoscopy Procedure Note  Patient identification was confirmed, informed consent was obtained, and patient was prepped using Betadine solution.  Lidocaine jelly was administered per urethral meatus.    Preoperative abx where received prior to procedure.     Pre-Procedure: - Inspection reveals a normal caliber ureteral meatus.  Procedure: The flexible cystoscope was introduced without difficulty - No urethral strictures/lesions are present. -  prostate  Prostate is slightly enlarged with lateral lobe and middle lobe enlargement but not obstructed  No tumors are seen on this surface of the prostate -  bladder neck - Bilateral ureteral orifices identified - Bladder mucosa  reveals no ulcers, no new tumors and site of previous tumor biopsies or resections are seen with no recurrent tumor near the site of previous carcinoma in situ tumors tumors, or lesions - No bladder stones - No trabeculation  Retroflexion shows  No masses at the bladder neck anteriorly  next cystoscopy is in 3 months. Patient is just finished his second round of 6 weeks of BCG treatment for C I S  Post-Procedure: - Patient tolerated the procedure well

## 2015-03-15 ENCOUNTER — Other Ambulatory Visit: Payer: Self-pay | Admitting: Family Medicine

## 2015-05-02 ENCOUNTER — Encounter: Payer: Self-pay | Admitting: Urology

## 2015-05-02 ENCOUNTER — Ambulatory Visit (INDEPENDENT_AMBULATORY_CARE_PROVIDER_SITE_OTHER): Payer: Medicare Other | Admitting: Urology

## 2015-05-02 VITALS — BP 160/76 | HR 88 | Ht 68.0 in | Wt 150.0 lb

## 2015-05-02 DIAGNOSIS — Z720 Tobacco use: Secondary | ICD-10-CM

## 2015-05-02 DIAGNOSIS — Z8551 Personal history of malignant neoplasm of bladder: Secondary | ICD-10-CM

## 2015-05-02 LAB — URINALYSIS, COMPLETE
Bilirubin, UA: NEGATIVE
GLUCOSE, UA: NEGATIVE
KETONES UA: NEGATIVE
Leukocytes, UA: NEGATIVE
NITRITE UA: NEGATIVE
RBC, UA: NEGATIVE
SPEC GRAV UA: 1.02 (ref 1.005–1.030)
UUROB: 0.2 mg/dL (ref 0.2–1.0)
pH, UA: 5 (ref 5.0–7.5)

## 2015-05-02 LAB — MICROSCOPIC EXAMINATION
Epithelial Cells (non renal): NONE SEEN /hpf (ref 0–10)
RBC MICROSCOPIC, UA: NONE SEEN /HPF (ref 0–?)

## 2015-05-02 MED ORDER — CIPROFLOXACIN HCL 500 MG PO TABS
500.0000 mg | ORAL_TABLET | Freq: Once | ORAL | Status: AC
  Administered 2015-05-02: 500 mg via ORAL

## 2015-05-02 MED ORDER — LIDOCAINE HCL 2 % EX GEL
1.0000 "application " | Freq: Once | CUTANEOUS | Status: AC
  Administered 2015-05-02: 1 via URETHRAL

## 2015-05-02 NOTE — Progress Notes (Addendum)
05/02/2015 11:50 AM   [place] [birth year] [id number]  Referring provider: Birdie Sons, MD [address] [place], [place] [address]  Chief Complaint  Patient presents with  . Cysto    HPI: 75 year old lifelong smoker who presents today for surveillance cystoscopy. His last cystoscopy was 3 months ago and negative for any recurrence.  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 cope. More recently, he was noted to have a suspicious area on surveillance cystoscopy at which time TURBT revealed CIS in 06/2014.  He completed BCG 6 in 6 2016.  He continues to smoke. No urinary issues today. No gross hematuria or dysuria.  PMH: Past Medical History  Diagnosis Date  . Malignant neoplasm of bladder ([place])   . Hyperthyroidism   . Nodular prostate with urinary retention   . TIA (transient ischemic attack)   . Acid reflux   . Asthma   . Hypertension   . Malignant neoplasm of lateral wall of urinary bladder ([place])   . Temporary cerebral vascular dysfunction 07/29/2014  . Airway hyperreactivity 07/29/2014  . 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 COPD, mild ([place]) 10/11/2014    Surgical History: Past Surgical History  Procedure Laterality Date  . Bladder surgery  2012  . Back surgery      Home Medications:    Medication List       This list is accurate as of: 05/02/15 11:50 AM.  Always use your most recent med list.               albuterol 108 (90 Base) MCG/ACT inhaler  Commonly known as:  PROVENTIL HFA;VENTOLIN HFA  Inhale into the lungs.     aspirin 325 MG EC tablet  Take 325 mg by mouth daily.     calcium carbonate 1250 MG capsule  Take by mouth.     cetirizine 10 MG tablet  Commonly known as:  ZYRTEC  Take 10 mg by mouth daily.     fenofibrate 160 MG tablet  Take by mouth.     finasteride 5 MG tablet  Commonly known as:  PROSCAR  Take 1 tablet (5 mg total) by  mouth daily.     FISH OIL EXTRA STRENGTH 1200 MG Caps  Take by mouth.     Fluticasone-Salmeterol 250-50 MCG/DOSE Aepb  Commonly known as:  ADVAIR  Inhale into the lungs.     levothyroxine 112 MCG tablet  Commonly known as:  SYNTHROID, LEVOTHROID  Take by mouth.     MULTI VITAMIN DAILY Tabs  Take by mouth.     omeprazole 40 MG capsule  Commonly known as:  PRILOSEC  Take by mouth.     pravastatin 80 MG tablet  Commonly known as:  PRAVACHOL  TAKE ONE TABLET BY MOUTH ONCE DAILY     tiotropium 18 MCG inhalation capsule  Commonly known as:  [place] into inhaler and inhale.        Allergies: No Known Allergies  Family History: Family History  Problem Relation Age of Onset  . Kidney disease Sister   . Diabetes Sister   . Bladder Cancer Neg Hx   . Prostate cancer Neg Hx     Social History:  reports that he has been smoking Cigarettes.  He has been smoking about 1.00 pack per day. He does not have any smokeless tobacco history on file. He reports that he does not drink alcohol  or use illicit drugs.   Physical Exam: BP 160/76 mmHg  Pulse 88  Ht '5\' 8"'$  (1.727 m)  Wt 150 lb (68.04 kg)  BMI 22.81 kg/m2  Constitutional:  Alert and oriented, No acute distress. HEENT: [place] AT, moist mucus membranes.  Trachea midline, no masses. Cardiovascular: No clubbing, cyanosis, or edema. Respiratory: Normal respiratory effort, no increased work of breathing. GI: Abdomen is soft, nontender, nondistended, no abdominal masses GU: Normal phallus with orthotopic meatus. Skin: No rashes, bruises or suspicious lesions. Neurologic: Grossly intact, no focal deficits, moving all 4 extremities. Psychiatric: Normal mood and affect.  Laboratory Data: Lab Results  Component Value Date   WBC 6.8 12/07/2014   HGB 12.9* 04/27/2010   HCT 37.6 12/07/2014   MCV 90.9 04/27/2010   PLT 252 04/27/2010    Lab Results  Component Value Date   CREATININE 1.25 12/07/2014    Urinalysis Results  for orders placed or performed in visit on 05/02/15  Microscopic Examination  Result Value Ref Range   WBC, UA 0-5 0 -  5 /hpf   RBC, UA None seen 0 -  2 /hpf   Epithelial Cells (non renal) None seen 0 - 10 /hpf   Mucus, UA Present (A) Not Estab.   Bacteria, UA Few (A) None seen/Few  Urinalysis, Complete  Result Value Ref Range   Specific Gravity, UA 1.020 1.005 - 1.030   pH, UA 5.0 5.0 - 7.5   Color, UA Yellow Yellow   Appearance Ur Clear Clear   Leukocytes, UA Negative Negative   Protein, UA 1+ (A) Negative/Trace   Glucose, UA Negative Negative   Ketones, UA Negative Negative   RBC, UA Negative Negative   Bilirubin, UA Negative Negative   Urobilinogen, Ur 0.2 0.2 - 1.0 mg/dL   Nitrite, UA Negative Negative   Microscopic Examination See below:      Pertinent Imaging: No recent imaging  Cystoscopy Procedure Note  Patient identification was confirmed, informed consent was obtained, and patient was prepped using Betadine solution.  Lidocaine jelly was administered per urethral meatus.    Preoperative abx where received prior to procedure.     Pre-Procedure: - Inspection reveals a normal caliber ureteral meatus.  Procedure: The flexible cystoscope was introduced without difficulty - No urethral strictures/lesions are present. - Normal prostate  - Elevated bladder neck (very mild) - Bilateral ureteral orifices identified - Bladder mucosa  reveals no ulcers, tumors, or lesions - No bladder stones - Minimal trabeculation  Retroflexion shows small intravesical mass effect from prostate, fairly unremarkable   Post-Procedure: - Patient tolerated the procedure well    Assessment & Plan:    1. History of recurrent bladder CA Most recent recurrence in 06/2014, CIS s/p BCG x 6 Surveillance cysto negative today - Urinalysis, Complete - ciprofloxacin (CIPRO) tablet 500 mg; Take 1 tablet (500 mg total) by mouth once. - lidocaine (XYLOCAINE) 2 % jelly 1 application;  Place 1 application into the urethra once.  -f/u urine cytology -repeat cysto in 3 months  2. Tobacco abuse Discussed the importance of smoking cessation and its relationship to bladder cancer. The patient is a lifelong smoker and has no intention of quitting.  Return in about 3 months (around 07/31/2015) for cystoscopy.  Hollice Espy, MD  [facility] [address], [place], [place] [address] [phone]    Addendum: Urine cytology negative

## 2015-05-04 ENCOUNTER — Other Ambulatory Visit: Payer: Medicare Other | Admitting: Urology

## 2015-05-08 ENCOUNTER — Encounter: Payer: Self-pay | Admitting: Urology

## 2015-05-10 ENCOUNTER — Encounter: Payer: Self-pay | Admitting: Urology

## 2015-06-07 ENCOUNTER — Ambulatory Visit (INDEPENDENT_AMBULATORY_CARE_PROVIDER_SITE_OTHER): Payer: Medicare Other | Admitting: Family Medicine

## 2015-06-07 ENCOUNTER — Encounter: Payer: Self-pay | Admitting: Family Medicine

## 2015-06-07 VITALS — BP 142/72 | HR 72 | Temp 98.1°F | Resp 16 | Ht 69.0 in | Wt 152.0 lb

## 2015-06-07 DIAGNOSIS — J439 Emphysema, unspecified: Secondary | ICD-10-CM | POA: Diagnosis not present

## 2015-06-07 DIAGNOSIS — E785 Hyperlipidemia, unspecified: Secondary | ICD-10-CM

## 2015-06-07 DIAGNOSIS — I10 Essential (primary) hypertension: Secondary | ICD-10-CM | POA: Diagnosis not present

## 2015-06-07 DIAGNOSIS — E039 Hypothyroidism, unspecified: Secondary | ICD-10-CM

## 2015-06-07 NOTE — Progress Notes (Signed)
Patient ID: Bruce May, male   DOB: Nov 13, 1940, 75 y.o.   MRN: [id number]       Patient: Bruce May Male    DOB: March 15, 1941   75 y.o.   MRN: [id number] Visit Date: 06/07/2015  Today's Provider: Vernie Murders, PA   Chief Complaint  Patient presents with  . Hyperlipidemia    6 month F/U   . Hypothyroidism  . Hypertension   Subjective:    Hyperlipidemia The problem is controlled. Exacerbating diseases include hypothyroidism. Pertinent negatives include no chest pain, focal weakness, leg pain, myalgias or shortness of breath. Current antihyperlipidemic treatment includes statins. There are no compliance problems.   Hypertension The problem is controlled. Pertinent negatives include no blurred vision, chest pain, palpitations, peripheral edema or shortness of breath. There are no compliance problems.   Thyroid Follow up: Patient reports that he has been tolerating medication well.   Patient Active Problem List   Diagnosis Date Noted  . Hypothyroidism 12/07/2014  . Weak pulse 10/11/2014  . Hypercholesterolemia without hypertriglyceridemia 10/11/2014  . Skin lesion 10/11/2014  . Adult hypothyroidism 10/11/2014  . H/O transient cerebral ischemia 10/11/2014  . H/O primary malignant neoplasm of urinary bladder 10/11/2014  . Fatigue 10/11/2014  . 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 Blood pressure elevated 10/11/2014  . COPD, mild ([place]) 10/11/2014  . Benign fibroma of prostate 10/11/2014  . Malignant neoplasm of bladder ([place]) 07/29/2014  . Acid reflux 07/29/2014  . Arthritis 07/29/2014  . Airway hyperreactivity 07/29/2014  . HLD (hyperlipidemia) 07/29/2014  . Hyperthyroidism 07/29/2014  . Cancer of lateral wall of urinary bladder ([place]) 07/29/2014  . Benign prostatic hyperplasia with urinary obstruction 07/29/2014  . Post-operative state 07/29/2014  . Temporary cerebral vascular dysfunction 07/29/2014  . Chronic kidney disease (CKD), stage III  (moderate) 12/14/2013  . Bladder retention 03/17/2012  . Bladder cancer ([place]) 03/17/2012  . Incomplete bladder emptying 03/17/2012  . Special screening for malignant neoplasm of prostate 02/17/2006   Past Surgical History  Procedure Laterality Date  . Bladder surgery  2012  . Back surgery     Family History  Problem Relation Age of Onset  . Kidney disease Sister   . Diabetes Sister   . Bladder Cancer Neg Hx   . Prostate cancer Neg Hx    No Known Allergies   Previous Medications   ALBUTEROL (PROVENTIL HFA;VENTOLIN HFA) 108 (90 BASE) MCG/ACT INHALER    Inhale into the lungs.   ASPIRIN 325 MG EC TABLET    Take 325 mg by mouth daily.   CALCIUM CARBONATE 1250 MG CAPSULE    Take by mouth.   CETIRIZINE (ZYRTEC) 10 MG TABLET    Take 10 mg by mouth daily.   FENOFIBRATE 160 MG TABLET    Take by mouth.   FINASTERIDE (PROSCAR) 5 MG TABLET    Take 1 tablet (5 mg total) by mouth daily.   FLUTICASONE-SALMETEROL (ADVAIR) 250-50 MCG/DOSE AEPB    Inhale into the lungs.   LEVOTHYROXINE (SYNTHROID, LEVOTHROID) 112 MCG TABLET    Take by mouth.   MULTIPLE VITAMIN (MULTI VITAMIN DAILY) TABS    Take by mouth.   OMEGA-3 FATTY ACIDS (FISH OIL EXTRA STRENGTH) 1200 MG CAPS    Take by mouth.   OMEPRAZOLE (PRILOSEC) 40 MG CAPSULE    Take by mouth.   PRAVASTATIN (PRAVACHOL) 80 MG TABLET    TAKE ONE TABLET BY MOUTH ONCE DAILY   TIOTROPIUM (SPIRIVA) 18 MCG INHALATION CAPSULE    Place  into inhaler and inhale.    Review of Systems  Eyes: Negative for blurred vision.  Respiratory: Negative for shortness of breath.   Cardiovascular: Negative for chest pain and palpitations.  Musculoskeletal: Negative for myalgias.  Neurological: Negative for focal weakness.    Social History  Substance Use Topics  . Smoking status: Current Every Day Smoker -- 1.00 packs/day    Types: Cigarettes  . Smokeless tobacco: Not on file  . Alcohol Use: No   Objective:   BP 142/72 mmHg  Pulse 72  Temp(Src) 98.1 F (36.7 C)   Resp 16  Ht '5\' 9"'$  (1.753 m)  Wt 152 lb (68.947 kg)  BMI 22.44 kg/m2  SpO2 98%  Physical Exam  Constitutional: He is oriented to person, place, and time. He appears well-developed and well-nourished.  HENT:  Head: Normocephalic.  Wearing bilateral hearing aids full time. Canals clean with TM's intact.  Eyes: Conjunctivae and EOM are normal.  Neck: Normal range of motion. Neck supple.  Cardiovascular: Normal rate and regular rhythm.   Pulmonary/Chest: Effort normal and breath sounds normal.  Abdominal: Soft. Bowel sounds are normal.  Musculoskeletal: Normal range of motion.  Neurological: He is alert and oriented to person, place, and time.  Psychiatric: He has a normal mood and affect. His behavior is normal.      Assessment & Plan:     1. HLD (hyperlipidemia) Tolerating Pravastatin and Fenofibrate without side effects. Will continue low fat diet and present dosages. Recheck labs and follow up pending reports. - COMPLETE METABOLIC PANEL WITH GFR - Lipid panel  2. Hypothyroidism, unspecified hypothyroidism type Denies depressive symptoms, constipation or weight gain of significance. Continue Levothyroxine 112 mcg qd and recheck TSH. - TSH  3. Essential hypertension Stable and well controlled without need for medication at the present. - CBC with Differential/Platelet  4. Pulmonary emphysema, unspecified emphysema type (HCC) Continues Albuterol prn, Spiriva daily with Advair BID daily. Had follow up with pulmonologist recently and awaiting final report of spirometry test.      Vernie Murders, [place]

## 2015-06-08 LAB — COMPREHENSIVE METABOLIC PANEL
A/G RATIO: 1.5 (ref 1.1–2.5)
ALBUMIN: 4 g/dL (ref 3.5–4.8)
ALK PHOS: 48 IU/L (ref 39–117)
ALT: 11 IU/L (ref 0–44)
AST: 24 IU/L (ref 0–40)
BUN / CREAT RATIO: 18 (ref 10–22)
BUN: 20 mg/dL (ref 8–27)
Bilirubin Total: 0.4 mg/dL (ref 0.0–1.2)
CALCIUM: 9.6 mg/dL (ref 8.6–10.2)
CO2: 23 mmol/L (ref 18–29)
Chloride: 104 mmol/L (ref 96–106)
Creatinine, Ser: 1.11 mg/dL (ref 0.76–1.27)
GFR calc Af Amer: 75 mL/min/{1.73_m2} (ref >59)
GFR, EST NON AFRICAN AMERICAN: 65 mL/min/{1.73_m2} (ref >59)
GLOBULIN, TOTAL: 2.7 g/dL (ref 1.5–4.5)
Glucose: 111 mg/dL — ABNORMAL HIGH (ref 65–99)
POTASSIUM: 5.1 mmol/L (ref 3.5–5.2)
SODIUM: 143 mmol/L (ref 134–144)
Total Protein: 6.7 g/dL (ref 6.0–8.5)

## 2015-06-08 LAB — CBC WITH DIFFERENTIAL/PLATELET
BASOS: 1 %
Basophils Absolute: 0 10*3/uL (ref 0.0–0.2)
EOS (ABSOLUTE): 0.6 10*3/uL — AB (ref 0.0–0.4)
Eos: 8 %
Hematocrit: 37.1 % — ABNORMAL LOW (ref 37.5–51.0)
Hemoglobin: 12.6 g/dL (ref 12.6–17.7)
IMMATURE GRANULOCYTES: 0 %
Immature Grans (Abs): 0 10*3/uL (ref 0.0–0.1)
Lymphocytes Absolute: 2.5 10*3/uL (ref 0.7–3.1)
Lymphs: 34 %
MCH: 31.7 pg (ref 26.6–33.0)
MCHC: 34 g/dL (ref 31.5–35.7)
MCV: 94 fL (ref 79–97)
MONOS ABS: 0.7 10*3/uL (ref 0.1–0.9)
Monocytes: 9 %
NEUTROS ABS: 3.6 10*3/uL (ref 1.4–7.0)
NEUTROS PCT: 48 %
PLATELETS: 344 10*3/uL (ref 150–379)
RBC: 3.97 x10E6/uL — ABNORMAL LOW (ref 4.14–5.80)
RDW: 13.3 % (ref 12.3–15.4)
WBC: 7.4 10*3/uL (ref 3.4–10.8)

## 2015-06-08 LAB — LIPID PANEL
CHOL/HDL RATIO: 4.8 ratio (ref 0.0–5.0)
CHOLESTEROL TOTAL: 154 mg/dL (ref 100–199)
HDL: 32 mg/dL — AB (ref >39)
LDL CALC: 103 mg/dL — AB (ref 0–99)
TRIGLYCERIDES: 93 mg/dL (ref 0–149)
VLDL Cholesterol Cal: 19 mg/dL (ref 5–40)

## 2015-06-08 LAB — TSH: TSH: 0.147 u[IU]/mL — ABNORMAL LOW (ref 0.450–4.500)

## 2015-06-09 ENCOUNTER — Other Ambulatory Visit: Payer: Self-pay | Admitting: Family Medicine

## 2015-06-12 ENCOUNTER — Telehealth: Payer: Self-pay

## 2015-06-12 ENCOUNTER — Other Ambulatory Visit: Payer: Self-pay | Admitting: Family Medicine

## 2015-06-12 MED ORDER — FENOFIBRATE 160 MG PO TABS: 160.0000 mg | ORAL_TABLET | Freq: Every day | ORAL | Status: AC

## 2015-06-12 NOTE — Telephone Encounter (Signed)
Patient's wife is requesting refill on Fenofibrate. He uses [facility] on [address].

## 2015-06-14 NOTE — Telephone Encounter (Signed)
Pt's wife would like to have lab results  Pt's wife also needs RX for fenofibrate 160 MG tablet [facility] ([place])

## 2015-06-15 NOTE — Telephone Encounter (Signed)
Chart indicates [facility] [place] received the refill authorization for the Fenofibrate on 06-12-15. Have them recheck with the pharmacy.

## 2015-06-15 NOTE — Telephone Encounter (Signed)
-----   Message from Margo Common, [place] sent at 06/15/2015  1:53 AM EST ----- Blood tests continues to show slightly low RBC count but Hgb in normal range now. Cholesterol and triglycerides better but HDL still below 39. Continue Pravastatin and Fenofibrate (refills sent to pharmacy on 06-12-15). Recommend multivitamin with iron to help RBC count. Recheck in 4 months.

## 2015-06-15 NOTE — Telephone Encounter (Signed)
Patient's wife Margaretha Sheffield advised as directed below. Wife verbalized understanding.

## 2015-07-31 ENCOUNTER — Other Ambulatory Visit: Payer: Medicare Other | Admitting: Urology

## 2015-08-02 ENCOUNTER — Encounter: Admission: RE | Payer: Self-pay | Source: Ambulatory Visit

## 2015-08-02 ENCOUNTER — Ambulatory Visit: Admission: RE | Admit: 2015-08-02 | Payer: Self-pay | Source: Ambulatory Visit | Admitting: Ophthalmology

## 2015-08-02 SURGERY — PHACOEMULSIFICATION, CATARACT, WITH IOL INSERTION
Anesthesia: Choice | Laterality: Left

## 2015-08-03 DIAGNOSIS — M48061 Spinal stenosis, lumbar region without neurogenic claudication: Secondary | ICD-10-CM | POA: Insufficient documentation

## 2015-08-03 DIAGNOSIS — M5459 Other low back pain: Secondary | ICD-10-CM | POA: Insufficient documentation

## 2015-08-03 DIAGNOSIS — M545 Low back pain: Secondary | ICD-10-CM | POA: Insufficient documentation

## 2015-08-07 ENCOUNTER — Ambulatory Visit (INDEPENDENT_AMBULATORY_CARE_PROVIDER_SITE_OTHER): Payer: Medicare Other | Admitting: Urology

## 2015-08-07 VITALS — BP 182/77 | HR 79 | Ht 68.0 in | Wt 156.0 lb

## 2015-08-07 DIAGNOSIS — C679 Malignant neoplasm of bladder, unspecified: Secondary | ICD-10-CM | POA: Diagnosis not present

## 2015-08-07 LAB — URINALYSIS, COMPLETE
Bilirubin, UA: NEGATIVE
Glucose, UA: NEGATIVE
Ketones, UA: NEGATIVE
Leukocytes, UA: NEGATIVE
Nitrite, UA: NEGATIVE
Specific Gravity, UA: >1.03 — ABNORMAL HIGH (ref 1.005–1.030)
Urobilinogen, Ur: 0.2 mg/dL (ref 0.2–1.0)
pH, UA: 6 (ref 5.0–7.5)

## 2015-08-07 LAB — MICROSCOPIC EXAMINATION: Bacteria, UA: NONE SEEN

## 2015-08-07 MED ORDER — CIPROFLOXACIN HCL 500 MG PO TABS
500.0000 mg | ORAL_TABLET | Freq: Once | ORAL | Status: AC
  Administered 2015-08-07: 500 mg via ORAL

## 2015-08-07 MED ORDER — LIDOCAINE HCL 2 % EX GEL
1.0000 "application " | Freq: Once | CUTANEOUS | Status: AC
  Administered 2015-08-07: 1 via URETHRAL

## 2015-08-07 NOTE — Progress Notes (Signed)
4:05 PM  08/07/2015   Bruce May 08-Aug-1940 [id number]  Referring provider: Birdie Sons, MD [address] [place], [place] [address]  No chief complaint on file.   HPI: 75 year old lifelong smoker who presents today for surveillance cystoscopy. His last cystoscopy was 3 months ago and negative for any recurrence (1/17).  Cytology also negative at that time.    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 cope. More recently, he was noted to have a suspicious area on surveillance cystoscopy at which time TURBT revealed CIS in 06/2014.  He completed BCG 6 in 09/2014.    He continues to smoke. No urinary issues today. No gross hematuria or dysuria.  PMH: Past Medical History  Diagnosis Date  . Malignant neoplasm of bladder ([place])   . Hyperthyroidism   . Nodular prostate with urinary retention   . TIA (transient ischemic attack)   . Acid reflux   . Asthma   . Hypertension   . Malignant neoplasm of lateral wall of urinary bladder ([place])   . Temporary cerebral vascular dysfunction 07/29/2014  . Airway hyperreactivity 07/29/2014  . 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 COPD, mild ([place]) 10/11/2014    Surgical History: Past Surgical History  Procedure Laterality Date  . Bladder surgery  2012  . Back surgery      Home Medications:    Medication List       This list is accurate as of: 08/07/15  4:05 PM.  Always use your most recent med list.               albuterol 108 (90 Base) MCG/ACT inhaler  Commonly known as:  PROVENTIL HFA;VENTOLIN HFA  Inhale into the lungs.     aspirin 325 MG EC tablet  Take 325 mg by mouth daily.     calcium carbonate 1250 MG capsule  Take by mouth.     cetirizine 10 MG tablet  Commonly known as:  ZYRTEC  Take 10 mg by mouth daily.     fenofibrate 160 MG tablet  Take 1 tablet (160 mg total) by mouth daily.     finasteride 5 MG tablet    Commonly known as:  PROSCAR  Take 1 tablet (5 mg total) by mouth daily.     FISH OIL EXTRA STRENGTH 1200 MG Caps  Take by mouth.     Fluticasone-Salmeterol 250-50 MCG/DOSE Aepb  Commonly known as:  ADVAIR  Inhale into the lungs.     levothyroxine 100 MCG tablet  Commonly known as:  SYNTHROID, LEVOTHROID     MULTI VITAMIN DAILY Tabs  Take by mouth.     omeprazole 40 MG capsule  Commonly known as:  PRILOSEC  Take by mouth.     pravastatin 80 MG tablet  Commonly known as:  PRAVACHOL  TAKE ONE TABLET BY MOUTH ONCE DAILY     tiotropium 18 MCG inhalation capsule  Commonly known as:  [place] into inhaler and inhale.        Allergies: No Known Allergies  Family History: Family History  Problem Relation Age of Onset  . Kidney disease Sister   . Diabetes Sister   . Bladder Cancer Neg Hx   . Prostate cancer Neg Hx     Social History:  reports that he has been smoking Cigarettes.  He has been smoking about 1.00 pack per day. He does not have any smokeless  tobacco history on file. He reports that he does not drink alcohol or use illicit drugs.   Physical Exam: BP 182/77 mmHg  Pulse 79  Ht '5\' 8"'$  (1.727 m)  Wt 156 lb (70.761 kg)  BMI 23.73 kg/m2  Constitutional:  Alert and oriented, No acute distress. HEENT: [place] AT, moist mucus membranes.  Trachea midline, no masses. Cardiovascular: No clubbing, cyanosis, or edema. Respiratory: Normal respiratory effort, no increased work of breathing. GI: Abdomen is soft, nontender, nondistended, no abdominal masses GU: Normal phallus with orthotopic meatus. Skin: No rashes, bruises or suspicious lesions. Neurologic: Grossly intact, no focal deficits, moving all 4 extremities. Psychiatric: Normal mood and affect.  Laboratory Data: Lab Results  Component Value Date   WBC 7.4 06/07/2015   HGB 12.9* 04/27/2010   HCT 37.1* 06/07/2015   MCV 94 06/07/2015   PLT 344 06/07/2015    Lab Results  Component Value Date    CREATININE 1.11 06/07/2015   UA reviewed, no evidence of infection  Pertinent Imaging: No recent imaging  Cystoscopy Procedure Note  Patient identification was confirmed, informed consent was obtained, and patient was prepped using Betadine solution.  Lidocaine jelly was administered per urethral meatus.    Preoperative abx where received prior to procedure.     Pre-Procedure: - Inspection reveals a normal caliber ureteral meatus.  Procedure: The flexible cystoscope was introduced without difficulty - No urethral strictures/lesions are present. - Normal prostate  - Elevated bladder neck (very mild) - Bilateral ureteral orifices identified - Bladder mucosa  reveals no ulcers, tumors, or lesions - No bladder stones - Minimal trabeculation  Retroflexion shows small intravesical mass effect from prostate, fairly unremarkable   Post-Procedure: - Patient tolerated the procedure well    Assessment & Plan:    1. History of recurrent bladder CA Most recent recurrence in 06/2014, CIS s/p BCG x 6 Surveillance cysto negative today - Urinalysis, Complete - ciprofloxacin (CIPRO) tablet 500 mg; Take 1 tablet (500 mg total) by mouth once. - lidocaine (XYLOCAINE) 2 % jelly 1 application; Place 1 application into the urethra once.  -f/u urine cytology -repeat cysto in 3 months  2. Tobacco abuse Discussed the importance of smoking cessation again today and its relationship to bladder cancer. The patient is a lifelong smoker and has no intention of quitting.  Return in about 3 months (around 11/06/2015) for cystoscopy.  Hollice Espy, MD  [facility] [address] [place], [place] [address] [phone]    Addendum: Urine cytology negative

## 2015-08-15 ENCOUNTER — Other Ambulatory Visit: Payer: Self-pay | Admitting: Family Medicine

## 2015-08-16 NOTE — Telephone Encounter (Signed)
Pt's wife would like omeprazole (PRILOSEC) 40 MG capsule sent to [facility] today. Thanks TNP

## 2015-08-23 ENCOUNTER — Ambulatory Visit
Admission: RE | Admit: 2015-08-23 | Discharge: 2015-08-23 | Disposition: A | Discharge status: Home / Self Care | Payer: Medicare Other | Source: Ambulatory Visit | Attending: Internal Medicine | Admitting: Internal Medicine

## 2015-08-23 ENCOUNTER — Other Ambulatory Visit: Payer: Self-pay | Admitting: Internal Medicine

## 2015-08-23 DIAGNOSIS — R918 Other nonspecific abnormal finding of lung field: Secondary | ICD-10-CM | POA: Insufficient documentation

## 2015-08-23 DIAGNOSIS — R05 Cough: Secondary | ICD-10-CM | POA: Diagnosis not present

## 2015-08-23 DIAGNOSIS — J449 Chronic obstructive pulmonary disease, unspecified: Secondary | ICD-10-CM | POA: Diagnosis not present

## 2015-08-23 DIAGNOSIS — R059 Cough, unspecified: Secondary | ICD-10-CM

## 2015-08-27 ENCOUNTER — Ambulatory Visit (INDEPENDENT_AMBULATORY_CARE_PROVIDER_SITE_OTHER): Payer: Medicare Other | Admitting: Family Medicine

## 2015-08-27 ENCOUNTER — Encounter: Payer: Self-pay | Admitting: Family Medicine

## 2015-08-27 VITALS — BP 146/78 | HR 80 | Temp 97.6°F | Resp 16 | Wt 156.6 lb

## 2015-08-27 DIAGNOSIS — K5900 Constipation, unspecified: Secondary | ICD-10-CM

## 2015-08-27 DIAGNOSIS — J189 Pneumonia, unspecified organism: Secondary | ICD-10-CM

## 2015-08-27 DIAGNOSIS — R609 Edema, unspecified: Secondary | ICD-10-CM

## 2015-08-27 NOTE — Progress Notes (Signed)
Patient ID: Bruce May, male   DOB: 05-02-1940, 75 y.o.   MRN: [id number]   Patient: Bruce May Male    DOB: 1941/04/19   75 y.o.   MRN: [id number] Visit Date: 08/27/2015  Today's Provider: Vernie Murders, PA   Chief Complaint  Patient presents with  . Leg Swelling  . Abdominal Pain  . Constipation   Subjective:    Abdominal Pain This is a new problem. The onset quality is sudden. The problem occurs intermittently. The problem has been unchanged. The pain is located in the LLQ. The pain is mild. Quality: discomfort. Associated symptoms include constipation. Pertinent negatives include no flatus. The pain is aggravated by coughing. He has tried nothing for the symptoms.  Constipation This is a new problem. The current episode started in the past 7 days. Associated symptoms include abdominal pain. Pertinent negatives include no flatus.  Leg Swelling: Left leg and foot swelling X 2 weeks. Patient reports swelling is worse at night.   Past Medical History  Diagnosis Date  . Malignant neoplasm of bladder ([place])   . Hyperthyroidism   . Nodular prostate with urinary retention   . TIA (transient ischemic attack)   . Acid reflux   . Asthma   . Hypertension   . 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 Temporary cerebral vascular dysfunction 07/29/2014  . Airway hyperreactivity 07/29/2014  . 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 COPD, mild ([place]) 10/11/2014   Past Surgical History  Procedure Laterality Date  . Bladder surgery  2012  . Back surgery     Family History  Problem Relation Age of Onset  . Kidney disease Sister   . Diabetes Sister   . Bladder Cancer Neg Hx   . Prostate cancer Neg Hx       Previous Medications   ALBUTEROL (PROVENTIL HFA;VENTOLIN HFA) 108 (90 BASE) MCG/ACT INHALER    Inhale into the lungs.   ASPIRIN 325 MG EC TABLET    Take 325 mg by mouth daily.   CALCIUM CARBONATE 1250 MG CAPSULE    Take by mouth.   CETIRIZINE (ZYRTEC) 10 MG TABLET    Take 10 mg by mouth daily.   CHANTIX STARTING MONTH PAK 0.5 MG X 11 & 1 MG X 42 TABLET       FENOFIBRATE 160 MG TABLET    Take 1 tablet (160 mg total) by mouth daily.   FINASTERIDE (PROSCAR) 5 MG TABLET    Take 1 tablet (5 mg total) by mouth daily.   FLUTICASONE-SALMETEROL (ADVAIR) 250-50 MCG/DOSE AEPB    Inhale into the lungs.   HYDROCODONE-ACETAMINOPHEN (NORCO/VICODIN) 5-325 MG TABLET    Take by mouth.   LEVOFLOXACIN (LEVAQUIN) 750 MG TABLET       LEVOTHYROXINE (SYNTHROID, LEVOTHROID) 100 MCG TABLET       MULTIPLE VITAMIN (MULTI VITAMIN DAILY) TABS    Take by mouth.   OMEGA-3 FATTY ACIDS (FISH OIL EXTRA STRENGTH) 1200 MG CAPS    Take by mouth.   OMEPRAZOLE (PRILOSEC) 40 MG CAPSULE    TAKE ONE CAPSULE BY MOUTH ONCE DAILY AS NEEDED   PRAVASTATIN (PRAVACHOL) 80 MG TABLET    TAKE ONE TABLET BY MOUTH ONCE DAILY   TIOTROPIUM (SPIRIVA) 18 MCG INHALATION CAPSULE    Place into inhaler and inhale.   No Known Allergies  Review of Systems  Constitutional: Negative.   HENT: Negative.   Eyes: Negative.   Respiratory: Negative.   Cardiovascular:  Positive for leg swelling.  Gastrointestinal: Positive for abdominal pain and constipation. Negative for flatus.  Endocrine: Negative.   Genitourinary: Negative.   Musculoskeletal: Negative.   Skin: Negative.   Allergic/Immunologic: Negative.   Neurological: Negative.   Hematological: Negative.   Psychiatric/Behavioral: Negative.     Social History  Substance Use Topics  . Smoking status: Current Every Day Smoker -- 1.00 packs/day    Types: Cigarettes  . Smokeless tobacco: Not on file  . Alcohol Use: No   Objective:   BP 146/78 mmHg  Pulse 80  Temp(Src) 97.6 F (36.4 C) (Oral)  Resp 16  Wt 156 lb 9.6 oz (71.033 kg) Body mass index is 23.82 kg/(m^2). Wt Readings from Last 3 Encounters:  08/27/15 156 lb 9.6 oz (71.033 kg)  08/07/15 156 lb (70.761 kg)  06/07/15 152 lb (68.947 kg)   BP Readings from Last  3 Encounters:  08/27/15 146/78  08/07/15 182/77  06/07/15 142/72   Physical Exam  Constitutional: He is oriented to person, place, and time. He appears well-developed and well-nourished. No distress.  HENT:  Head: Normocephalic and atraumatic.  Right Ear: Hearing normal.  Left Ear: Hearing normal.  Nose: Nose normal.  Significant hearing loss even with use of hearing aids. White patches on posterior soft palate and tonsillar pillars.  Eyes: Conjunctivae and lids are normal. Right eye exhibits no discharge. Left eye exhibits no discharge. No scleral icterus.  Neck: Neck supple.  Cardiovascular: Normal rate and regular rhythm.   Pulmonary/Chest: Effort normal. No respiratory distress.  Distant breath sounds.  Abdominal: Soft. Bowel sounds are normal. He exhibits no distension and no mass. There is no rebound and no guarding.  Minimal soreness in RLQ.  Musculoskeletal: Normal range of motion.  Minimal puffiness in left ankle. Good pulses and no pitting edema. No redness of feet.  Lymphadenopathy:    He has no cervical adenopathy.  Neurological: He is alert and oriented to person, place, and time.  Skin: Skin is intact. No lesion and no rash noted.  Psychiatric: He has a normal mood and affect. His speech is normal and behavior is normal. Thought content normal.      Assessment & Plan:     1. Constipation, unspecified constipation type Last BM was 5 days ago and having some RLQ discomfort. No pain or tenderness at the present. Stool last week was hard and dry. No hematochezia or melena. Recommend using Mirilax and Glycerine suppository today and tomorrow. No sign of hernia on exam. Call report of action if no better.  2. Peripheral edema Mild by the end of the day. Wife reports he is a heavy salt user. Recommend sodium restriction and elevate feet when possible. Has noticed puffiness clears by morning.  - Comprehensive metabolic panel  3. Pneumonia involving left lung, unspecified  part of lung Diagnosed by pulmonologist (Dr. Humphrey Rolls) and feeling some better with use of the Duoneb by a nebulizer at home. Still using Breo 200/60mg/INH qd, Albuterol-HFA 2 puffs QID prn, Spiriva qd and on his second round of Levaquin 750 mg qd. Will follow up with Dr. KHumphrey Rollsin a couple weeks. Was treated for oral thrush from ICS with Nystatin. Finally working on smoking cessation program with the Chantix prescribed by Dr. KHumphrey Rolls - CBC with Differential/Platelet

## 2015-08-28 LAB — COMPREHENSIVE METABOLIC PANEL
ALBUMIN: 3.4 g/dL — AB (ref 3.5–4.8)
ALT: 21 IU/L (ref 0–44)
AST: 40 IU/L (ref 0–40)
Albumin/Globulin Ratio: 1.2 (ref 1.2–2.2)
Alkaline Phosphatase: 76 IU/L (ref 39–117)
BUN / CREAT RATIO: 17 (ref 10–24)
BUN: 18 mg/dL (ref 8–27)
Bilirubin Total: 0.5 mg/dL (ref 0.0–1.2)
CALCIUM: 8.9 mg/dL (ref 8.6–10.2)
CO2: 23 mmol/L (ref 18–29)
Chloride: 79 mmol/L — ABNORMAL LOW (ref 96–106)
Creatinine, Ser: 1.03 mg/dL (ref 0.76–1.27)
GFR, EST AFRICAN AMERICAN: 81 mL/min/{1.73_m2} (ref >59)
GFR, EST NON AFRICAN AMERICAN: 70 mL/min/{1.73_m2} (ref >59)
GLOBULIN, TOTAL: 2.8 g/dL (ref 1.5–4.5)
Glucose: 98 mg/dL (ref 65–99)
POTASSIUM: 5.1 mmol/L (ref 3.5–5.2)
SODIUM: 117 mmol/L — AB (ref 134–144)
TOTAL PROTEIN: 6.2 g/dL (ref 6.0–8.5)

## 2015-08-28 LAB — CBC WITH DIFFERENTIAL/PLATELET
BASOS: 0 %
Basophils Absolute: 0 10*3/uL (ref 0.0–0.2)
EOS (ABSOLUTE): 0.2 10*3/uL (ref 0.0–0.4)
EOS: 1 %
HEMATOCRIT: 37.5 % (ref 37.5–51.0)
Hemoglobin: 12.5 g/dL — ABNORMAL LOW (ref 12.6–17.7)
IMMATURE GRANULOCYTES: 2 %
Immature Grans (Abs): 0.3 10*3/uL — ABNORMAL HIGH (ref 0.0–0.1)
LYMPHS ABS: 2.2 10*3/uL (ref 0.7–3.1)
Lymphs: 14 %
MCH: 30.1 pg (ref 26.6–33.0)
MCHC: 33.3 g/dL (ref 31.5–35.7)
MCV: 90 fL (ref 79–97)
MONOS ABS: 1.4 10*3/uL — AB (ref 0.1–0.9)
Monocytes: 9 %
NEUTROS PCT: 74 %
Neutrophils Absolute: 11.3 10*3/uL — ABNORMAL HIGH (ref 1.4–7.0)
Platelets: 518 10*3/uL — ABNORMAL HIGH (ref 150–379)
RBC: 4.15 x10E6/uL (ref 4.14–5.80)
RDW: 14.3 % (ref 12.3–15.4)
WBC: 15.4 10*3/uL — ABNORMAL HIGH (ref 3.4–10.8)

## 2015-08-30 ENCOUNTER — Emergency Department: Payer: Medicare Other

## 2015-08-30 ENCOUNTER — Inpatient Hospital Stay
Admission: EM | Admit: 2015-08-30 | Discharge: 2015-09-03 | DRG: 643 | Disposition: A | Discharge status: Home Health | Payer: Medicare Other | Attending: Specialist | Admitting: Specialist

## 2015-08-30 ENCOUNTER — Encounter: Payer: Self-pay | Admitting: Emergency Medicine

## 2015-08-30 ENCOUNTER — Telehealth: Payer: Self-pay | Admitting: Family Medicine

## 2015-08-30 DIAGNOSIS — E222 Syndrome of inappropriate secretion of antidiuretic hormone: Secondary | ICD-10-CM | POA: Diagnosis present

## 2015-08-30 DIAGNOSIS — Z7982 Long term (current) use of aspirin: Secondary | ICD-10-CM

## 2015-08-30 DIAGNOSIS — I10 Essential (primary) hypertension: Secondary | ICD-10-CM | POA: Diagnosis present

## 2015-08-30 DIAGNOSIS — Z95828 Presence of other vascular implants and grafts: Secondary | ICD-10-CM

## 2015-08-30 DIAGNOSIS — E785 Hyperlipidemia, unspecified: Secondary | ICD-10-CM | POA: Diagnosis present

## 2015-08-30 DIAGNOSIS — Z8673 Personal history of transient ischemic attack (TIA), and cerebral infarction without residual deficits: Secondary | ICD-10-CM

## 2015-08-30 DIAGNOSIS — J189 Pneumonia, unspecified organism: Secondary | ICD-10-CM | POA: Diagnosis not present

## 2015-08-30 DIAGNOSIS — N400 Enlarged prostate without lower urinary tract symptoms: Secondary | ICD-10-CM | POA: Diagnosis present

## 2015-08-30 DIAGNOSIS — K219 Gastro-esophageal reflux disease without esophagitis: Secondary | ICD-10-CM | POA: Diagnosis present

## 2015-08-30 DIAGNOSIS — D649 Anemia, unspecified: Secondary | ICD-10-CM | POA: Diagnosis present

## 2015-08-30 DIAGNOSIS — E871 Hypo-osmolality and hyponatremia: Secondary | ICD-10-CM | POA: Diagnosis not present

## 2015-08-30 DIAGNOSIS — Z452 Encounter for adjustment and management of vascular access device: Secondary | ICD-10-CM

## 2015-08-30 DIAGNOSIS — J440 Chronic obstructive pulmonary disease with acute lower respiratory infection: Secondary | ICD-10-CM | POA: Diagnosis present

## 2015-08-30 DIAGNOSIS — R918 Other nonspecific abnormal finding of lung field: Secondary | ICD-10-CM

## 2015-08-30 DIAGNOSIS — R4182 Altered mental status, unspecified: Secondary | ICD-10-CM

## 2015-08-30 DIAGNOSIS — K769 Liver disease, unspecified: Secondary | ICD-10-CM | POA: Diagnosis present

## 2015-08-30 DIAGNOSIS — Z8551 Personal history of malignant neoplasm of bladder: Secondary | ICD-10-CM | POA: Diagnosis not present

## 2015-08-30 DIAGNOSIS — R590 Localized enlarged lymph nodes: Secondary | ICD-10-CM | POA: Diagnosis present

## 2015-08-30 DIAGNOSIS — F17210 Nicotine dependence, cigarettes, uncomplicated: Secondary | ICD-10-CM | POA: Diagnosis present

## 2015-08-30 DIAGNOSIS — J159 Unspecified bacterial pneumonia: Secondary | ICD-10-CM | POA: Diagnosis present

## 2015-08-30 DIAGNOSIS — R160 Hepatomegaly, not elsewhere classified: Secondary | ICD-10-CM

## 2015-08-30 DIAGNOSIS — Z79899 Other long term (current) drug therapy: Secondary | ICD-10-CM | POA: Diagnosis not present

## 2015-08-30 DIAGNOSIS — R531 Weakness: Secondary | ICD-10-CM | POA: Diagnosis present

## 2015-08-30 LAB — URIC ACID
URIC ACID, SERUM: 3.2 mg/dL — AB (ref 4.4–7.6)
Uric Acid, Serum: 3.3 mg/dL — ABNORMAL LOW (ref 4.4–7.6)

## 2015-08-30 LAB — GLUCOSE, CAPILLARY: GLUCOSE-CAPILLARY: 130 mg/dL — AB (ref 65–99)

## 2015-08-30 LAB — CBC
HCT: 30.7 % — ABNORMAL LOW (ref 40.0–52.0)
HCT: 30.1 % — ABNORMAL LOW (ref 40.0–52.0)
HEMOGLOBIN: 10.6 g/dL — AB (ref 13.0–18.0)
Hemoglobin: 10.5 g/dL — ABNORMAL LOW (ref 13.0–18.0)
MCH: 30.1 pg (ref 26.0–34.0)
MCH: 30.2 pg (ref 26.0–34.0)
MCHC: 34.6 g/dL (ref 32.0–36.0)
MCHC: 34.7 g/dL (ref 32.0–36.0)
MCV: 86.8 fL (ref 80.0–100.0)
MCV: 86.9 fL (ref 80.0–100.0)
PLATELETS: 438 10*3/uL (ref 150–440)
PLATELETS: 404 10*3/uL (ref 150–440)
RBC: 3.54 MIL/uL — AB (ref 4.40–5.90)
RBC: 3.47 MIL/uL — AB (ref 4.40–5.90)
RDW: 13.6 % (ref 11.5–14.5)
RDW: 13.8 % (ref 11.5–14.5)
WBC: 14.2 10*3/uL — AB (ref 3.8–10.6)
WBC: 13.2 10*3/uL — ABNORMAL HIGH (ref 3.8–10.6)

## 2015-08-30 LAB — CREATININE, SERUM
Creatinine, Ser: 0.83 mg/dL (ref 0.61–1.24)
GFR calc Af Amer: >60 mL/min
GFR calc non Af Amer: >60 mL/min

## 2015-08-30 LAB — TROPONIN I: Troponin I: 0.06 ng/mL — ABNORMAL HIGH (ref <0.031)

## 2015-08-30 LAB — LACTIC ACID, PLASMA
LACTIC ACID, VENOUS: 0.9 mmol/L (ref 0.5–2.0)
Lactic Acid, Venous: 0.9 mmol/L (ref 0.5–2.0)

## 2015-08-30 LAB — COMPREHENSIVE METABOLIC PANEL
ALT: 23 U/L (ref 17–63)
ANION GAP: 8 (ref 5–15)
AST: 41 U/L (ref 15–41)
Albumin: 3 g/dL — ABNORMAL LOW (ref 3.5–5.0)
Alkaline Phosphatase: 58 U/L (ref 38–126)
BUN: 19 mg/dL (ref 6–20)
CALCIUM: 8.4 mg/dL — AB (ref 8.9–10.3)
CHLORIDE: 86 mmol/L — AB (ref 101–111)
CO2: 21 mmol/L — AB (ref 22–32)
CREATININE: 0.81 mg/dL (ref 0.61–1.24)
Glucose, Bld: 121 mg/dL — ABNORMAL HIGH (ref 65–99)
Potassium: 4.9 mmol/L (ref 3.5–5.1)
SODIUM: 115 mmol/L — AB (ref 135–145)
Total Bilirubin: 0.6 mg/dL (ref 0.3–1.2)
Total Protein: 6.3 g/dL — ABNORMAL LOW (ref 6.5–8.1)

## 2015-08-30 LAB — LACTATE DEHYDROGENASE: LDH: 234 U/L — AB (ref 98–192)

## 2015-08-30 LAB — MRSA PCR SCREENING: MRSA by PCR: NEGATIVE

## 2015-08-30 LAB — TSH: TSH: 2.836 u[IU]/mL (ref 0.350–4.500)

## 2015-08-30 LAB — OSMOLALITY: OSMOLALITY: 244 mosm/kg — AB (ref 275–295)

## 2015-08-30 LAB — LIPASE, BLOOD: Lipase: 23 U/L (ref 11–51)

## 2015-08-30 LAB — SODIUM: Sodium: 117 mmol/L — CL (ref 135–145)

## 2015-08-30 MED ORDER — FINASTERIDE 5 MG PO TABS
5.0000 mg | ORAL_TABLET | Freq: Every day | ORAL | Status: DC
  Administered 2015-08-31 – 2015-09-03 (×4): 5 mg via ORAL
  Filled 2015-08-30 (×4): qty 1

## 2015-08-30 MED ORDER — ENOXAPARIN SODIUM 40 MG/0.4ML ~~LOC~~ SOLN
40.0000 mg | SUBCUTANEOUS | Status: DC
  Administered 2015-08-30 – 2015-09-02 (×4): 40 mg via SUBCUTANEOUS
  Filled 2015-08-30 (×6): qty 0.4

## 2015-08-30 MED ORDER — PREDNISONE 10 MG (21) PO TBPK: 10.0000 mg | ORAL_TABLET | ORAL | Status: DC

## 2015-08-30 MED ORDER — PANTOPRAZOLE SODIUM 40 MG PO TBEC
40.0000 mg | DELAYED_RELEASE_TABLET | Freq: Every day | ORAL | Status: DC
  Administered 2015-08-31 – 2015-09-03 (×4): 40 mg via ORAL
  Filled 2015-08-30 (×4): qty 1

## 2015-08-30 MED ORDER — PREDNISONE 10 MG (21) PO TBPK
10.0000 mg | ORAL_TABLET | Freq: Every day | ORAL | Status: DC
  Filled 2015-08-30: qty 21

## 2015-08-30 MED ORDER — PREDNISONE 10 MG (21) PO TBPK: 10.0000 mg | ORAL_TABLET | Freq: Three times a day (TID) | ORAL | Status: DC

## 2015-08-30 MED ORDER — ASPIRIN EC 325 MG PO TBEC
325.0000 mg | DELAYED_RELEASE_TABLET | Freq: Every day | ORAL | Status: DC
  Administered 2015-08-31 – 2015-09-02 (×3): 325 mg via ORAL
  Filled 2015-08-30 (×3): qty 1

## 2015-08-30 MED ORDER — BISACODYL 10 MG RE SUPP
10.0000 mg | Freq: Every day | RECTAL | Status: DC
  Administered 2015-08-31 – 2015-09-01 (×2): 10 mg via RECTAL
  Filled 2015-08-30 (×4): qty 1

## 2015-08-30 MED ORDER — ADULT MULTIVITAMIN W/MINERALS CH
1.0000 | ORAL_TABLET | Freq: Every day | ORAL | Status: DC
  Administered 2015-08-31 – 2015-09-03 (×4): 1 via ORAL
  Filled 2015-08-30 (×4): qty 1

## 2015-08-30 MED ORDER — ACETAMINOPHEN 325 MG PO TABS: 650.0000 mg | ORAL_TABLET | Freq: Four times a day (QID) | ORAL | Status: DC | PRN

## 2015-08-30 MED ORDER — SODIUM CHLORIDE 0.9 % IV BOLUS (SEPSIS)
250.0000 mL | Freq: Once | INTRAVENOUS | Status: AC
  Administered 2015-08-30: 250 mL via INTRAVENOUS

## 2015-08-30 MED ORDER — FENOFIBRATE 160 MG PO TABS
160.0000 mg | ORAL_TABLET | Freq: Every day | ORAL | Status: DC
  Administered 2015-08-31 – 2015-09-03 (×4): 160 mg via ORAL
  Filled 2015-08-30 (×4): qty 1

## 2015-08-30 MED ORDER — PREDNISONE 10 MG (21) PO TBPK
10.0000 mg | ORAL_TABLET | Freq: Every day | ORAL | Status: DC
  Administered 2015-08-31 – 2015-09-03 (×4): 10 mg via ORAL
  Filled 2015-08-30: qty 21

## 2015-08-30 MED ORDER — PREDNISONE 10 MG (21) PO TBPK: 20.0000 mg | ORAL_TABLET | Freq: Every evening | ORAL | Status: DC

## 2015-08-30 MED ORDER — PREDNISONE 10 MG (21) PO TBPK: 10.0000 mg | ORAL_TABLET | Freq: Four times a day (QID) | ORAL | Status: DC

## 2015-08-30 MED ORDER — ONDANSETRON HCL 4 MG/2ML IJ SOLN: 4.0000 mg | Freq: Four times a day (QID) | INTRAMUSCULAR | Status: DC | PRN

## 2015-08-30 MED ORDER — HYDROCODONE-ACETAMINOPHEN 5-325 MG PO TABS
1.0000 | ORAL_TABLET | ORAL | Status: DC | PRN
  Administered 2015-08-30 – 2015-08-31 (×2): 1 via ORAL
  Administered 2015-09-03: 2 via ORAL
  Filled 2015-08-30: qty 1
  Filled 2015-08-30: qty 2
  Filled 2015-08-30: qty 1

## 2015-08-30 MED ORDER — ONDANSETRON HCL 4 MG PO TABS: 4.0000 mg | ORAL_TABLET | Freq: Four times a day (QID) | ORAL | Status: DC | PRN

## 2015-08-30 MED ORDER — SODIUM CHLORIDE 0.9 % IV SOLN
INTRAVENOUS | Status: DC
  Administered 2015-08-30: 22:00:00 via INTRAVENOUS

## 2015-08-30 MED ORDER — LEVOFLOXACIN 750 MG PO TABS
750.0000 mg | ORAL_TABLET | Freq: Every day | ORAL | Status: DC
Start: 2015-08-31 — End: 2015-09-03
  Administered 2015-08-31 – 2015-09-03 (×4): 750 mg via ORAL
  Filled 2015-08-30 (×5): qty 1

## 2015-08-30 MED ORDER — TIOTROPIUM BROMIDE MONOHYDRATE 18 MCG IN CAPS
18.0000 ug | ORAL_CAPSULE | Freq: Two times a day (BID) | RESPIRATORY_TRACT | Status: DC
  Administered 2015-08-30 – 2015-09-03 (×7): 18 ug via RESPIRATORY_TRACT
  Filled 2015-08-30 (×2): qty 5

## 2015-08-30 MED ORDER — POLYETHYLENE GLYCOL 3350 17 G PO PACK
17.0000 g | PACK | Freq: Every day | ORAL | Status: DC
  Administered 2015-08-30 – 2015-09-01 (×3): 17 g via ORAL
  Filled 2015-08-30 (×4): qty 1

## 2015-08-30 MED ORDER — SENNA 8.6 MG PO TABS
1.0000 | ORAL_TABLET | Freq: Every day | ORAL | Status: DC
  Administered 2015-08-30 – 2015-09-03 (×4): 8.6 mg via ORAL
  Filled 2015-08-30 (×4): qty 1

## 2015-08-30 MED ORDER — LACTULOSE 10 GM/15ML PO SOLN
30.0000 g | Freq: Two times a day (BID) | ORAL | Status: DC
  Administered 2015-08-30 – 2015-09-01 (×4): 30 g via ORAL
  Filled 2015-08-30 (×5): qty 60

## 2015-08-30 MED ORDER — ACETAMINOPHEN 650 MG RE SUPP: 650.0000 mg | Freq: Four times a day (QID) | RECTAL | Status: DC | PRN

## 2015-08-30 MED ORDER — PREDNISONE 10 MG (21) PO TBPK: 20.0000 mg | ORAL_TABLET | Freq: Every morning | ORAL | Status: DC

## 2015-08-30 MED ORDER — FENOPROFEN CALCIUM 600 MG PO TABS: 600.0000 mg | ORAL_TABLET | Freq: Every day | ORAL | Status: DC

## 2015-08-30 MED ORDER — LORATADINE 10 MG PO TABS
10.0000 mg | ORAL_TABLET | Freq: Every day | ORAL | Status: DC
  Administered 2015-08-31 – 2015-09-03 (×4): 10 mg via ORAL
  Filled 2015-08-30 (×4): qty 1

## 2015-08-30 MED ORDER — IPRATROPIUM-ALBUTEROL 0.5-2.5 (3) MG/3ML IN SOLN
3.0000 mL | Freq: Once | RESPIRATORY_TRACT | Status: AC
  Administered 2015-08-30: 3 mL via RESPIRATORY_TRACT
  Filled 2015-08-30: qty 3

## 2015-08-30 MED ORDER — PRAVASTATIN SODIUM 40 MG PO TABS
80.0000 mg | ORAL_TABLET | Freq: Every day | ORAL | Status: DC
  Administered 2015-08-31 – 2015-09-03 (×4): 80 mg via ORAL
  Filled 2015-08-30 (×4): qty 2

## 2015-08-30 MED ORDER — FLUTICASONE FUROATE-VILANTEROL 200-25 MCG/INH IN AEPB
2.0000 | INHALATION_SPRAY | Freq: Every day | RESPIRATORY_TRACT | Status: DC
  Administered 2015-08-31 – 2015-09-03 (×4): 2 via RESPIRATORY_TRACT
  Filled 2015-08-30 (×2): qty 28

## 2015-08-30 MED ORDER — FLEET ENEMA 7-19 GM/118ML RE ENEM: 1.0000 | ENEMA | Freq: Once | RECTAL | Status: DC

## 2015-08-30 NOTE — ED Notes (Signed)
Report given to Vanessa, RN.

## 2015-08-30 NOTE — ED Notes (Signed)
Per family we was told to come to ED for abnormal lab work  Low NA

## 2015-08-30 NOTE — Telephone Encounter (Signed)
Needs abdominal XR to rule out bowel obstruction Please keep in mind that I am not here this afternoon to review Xr results. If he has XR this afternoon the result will need to be forwarded to another MD to review.

## 2015-08-30 NOTE — Telephone Encounter (Signed)
LMOVM for pt to return call 

## 2015-08-30 NOTE — ED Notes (Signed)
Pt alert in the bed, given a pillow upon request, pt's family at bedside. Pt alert and oriented at this time, calm and cooperative with staff.

## 2015-08-30 NOTE — ED Provider Notes (Signed)
[facility] Emergency Department Provider Note  ____________________________________________  Time seen: Approximately 6:16 PM  I have reviewed the triage vital signs and the nursing notes.   HISTORY  Chief Complaint Altered Mental Status    HPI Bruce May is a 75 y.o. male a previous history of bladder cancer, hypothyroidism, previous TIA, COPD.  The patient presents today after having had a cough since been fairly persistent for the last 2-3 weeks. He's been treated with 2 treatment of Levaquin in the last 2 weeks per his family with no improvement. They've noticed that over the last 3 or 4 days he seemed a little bit confused at times, he is more weak and fatigued than normal, that he fell in the bath of 3 or 4 days ago without any injury, but they're concerned that he is continuing to seem very sick. He saw his doctor, and they called him and told to come emergency room because his blood work was concerning.  He did have a fever a few days ago but it went away. No wheezing, or shortness of breath. He has had a mild persistent cough. Denies any chest pain. No abdominal pain. They have noted that he felt a little constipated today, he had a small bowel movement and is passing gas, and he does not have any pain but he feels "bloated".   Past Medical History  Diagnosis Date  . Malignant neoplasm of bladder ([place])   . Hyperthyroidism   . Nodular prostate with urinary retention   . TIA (transient ischemic attack)   . Acid reflux   . Asthma   . Hypertension   . 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 Temporary cerebral vascular dysfunction 07/29/2014  . Airway hyperreactivity 07/29/2014  . 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 COPD, mild ([place]) 10/11/2014    Patient Active Problem List   Diagnosis Date Noted  . Hyponatremia 08/30/2015  . Mechanical low back pain 08/03/2015  . Lumbar canal stenosis 08/03/2015   . Hypothyroidism 12/07/2014  . Weak pulse 10/11/2014  . Hypercholesterolemia without hypertriglyceridemia 10/11/2014  . Skin lesion 10/11/2014  . Adult hypothyroidism 10/11/2014  . H/O transient cerebral ischemia 10/11/2014  . H/O primary malignant neoplasm of urinary bladder 10/11/2014  . Fatigue 10/11/2014  . Blood pressure elevated 10/11/2014  . COPD, mild ([place]) 10/11/2014  . Benign fibroma of prostate 10/11/2014  . Malignant neoplasm of bladder ([place]) 07/29/2014  . Acid reflux 07/29/2014  . Arthritis 07/29/2014  . Airway hyperreactivity 07/29/2014  . HLD (hyperlipidemia) 07/29/2014  . Hyperthyroidism 07/29/2014  . Cancer of lateral wall of urinary bladder ([place]) 07/29/2014  . Benign prostatic hyperplasia with urinary obstruction 07/29/2014  . Temporary cerebral vascular dysfunction 07/29/2014  . Chronic kidney disease (CKD), stage III (moderate) 12/14/2013  . Bladder retention 03/17/2012  . Bladder cancer ([place]) 03/17/2012  . Incomplete bladder emptying 03/17/2012  . Malignant neoplasm of urinary bladder ([place]) 03/17/2012  . 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 Bladder surgery  2012  . Back surgery      No current outpatient prescriptions on file.  Allergies Review of patient's allergies indicates no known allergies.  Family History  Problem Relation Age of Onset  . Kidney disease Sister   . Diabetes Sister   . Bladder Cancer Neg Hx   . Prostate cancer Neg Hx     Social  History Social History  Substance Use Topics  . Smoking status: Current Every Day Smoker -- 1.00 packs/day    Types: Cigarettes  . Smokeless tobacco: None  . Alcohol Use: No    Review of Systems Constitutional: See history of present illness Eyes: No visual changes. ENT: No sore throat. Cardiovascular: Denies chest pain. Respiratory: Denies shortness of breath. Occasional frequent dry  cough. Gastrointestinal: No abdominal pain.  No nausea, no vomiting.  No diarrhea.   Genitourinary: Negative for dysuria. Musculoskeletal: Negative for back pain. Skin: Negative for rash. Neurological: Negative for headaches, focal weakness or numbness.  10-point ROS otherwise negative.  ____________________________________________   PHYSICAL EXAM:  VITAL SIGNS: ED Triage Vitals  Enc Vitals Group     BP 08/30/15 1740 156/73 mmHg     Pulse Rate 08/30/15 1740 76     Resp 08/30/15 1740 16     Temp 08/30/15 1740 98.1 F (36.7 C)     Temp Source 08/30/15 1740 Oral     SpO2 08/30/15 1740 98 %     Weight 08/30/15 1740 150 lb (68.04 kg)     Height 08/30/15 1740 '5\' 9"'$  (1.753 m)     Head Cir --      Peak Flow --      Pain Score --      Pain Loc --      Pain Edu? --      Excl. in [place]? --    Constitutional: Alert and oriented. Mildly ill-appearing, appears fatigued, but in no distress. Very pleasant and hard of hearing. Eyes: Conjunctivae are normal. PERRL. EOMI. Head: Atraumatic. Nose: No congestion/rhinnorhea. Mouth/Throat: Mucous membranes are dry.  Oropharynx non-erythematous. Neck: No stridor.   Cardiovascular: Normal rate, regular rhythm. Grossly normal heart sounds.  Good peripheral circulation. Respiratory: Normal respiratory effort.  No retractions. Lungs CTAB For some mild coarse breath sounds heard questionably on the left upper lobe. Gastrointestinal: Soft and nontender. Minimal distention, but soft and nontender. A small umbilical hernia, soft reducible and nontender. No abdominal bruits. Musculoskeletal: No lower extremity tenderness nor edema.  No joint effusions. Neurologic:  Normal speech and language. No gross focal neurologic deficits are appreciated. Patient does seem fatigued. He is slightly somnolent, no evidence of acute distress or obvious focal neurologic deficit. Skin:  Skin is warm, dry and intact. No rash noted. Psychiatric: Mood and affect are slightly  flat. Speech is clear. ____________________________________________   LABS (all labs ordered are listed, but only abnormal results are displayed)  Labs Reviewed  COMPREHENSIVE METABOLIC PANEL - Abnormal; Notable for the following:    Sodium 115 (*)    Chloride 86 (*)    CO2 21 (*)    Glucose, Bld 121 (*)    Calcium 8.4 (*)    Total Protein 6.3 (*)    Albumin 3.0 (*)    All other components within normal limits  CBC - Abnormal; Notable for the following:    WBC 14.2 (*)    RBC 3.54 (*)    Hemoglobin 10.6 (*)    HCT 30.7 (*)    All other components within normal limits  URIC ACID - Abnormal; Notable for the following:    Uric Acid, Serum 3.2 (*)    All other components within normal limits  OSMOLALITY - Abnormal; Notable for the following:    Osmolality 244 (*)    All other components within normal limits  TROPONIN I - Abnormal; Notable for the following:    Troponin I 0.06 (*)  All other components within normal limits  LACTATE DEHYDROGENASE - Abnormal; Notable for the following:    LDH 234 (*)    All other components within normal limits  CBC - Abnormal; Notable for the following:    WBC 13.2 (*)    RBC 3.47 (*)    Hemoglobin 10.5 (*)    HCT 30.1 (*)    All other components within normal limits  URIC ACID - Abnormal; Notable for the following:    Uric Acid, Serum 3.3 (*)    All other components within normal limits  SODIUM - Abnormal; Notable for the following:    Sodium 117 (*)    All other components within normal limits  GLUCOSE, CAPILLARY - Abnormal; Notable for the following:    Glucose-Capillary 130 (*)    All other components within normal limits  CULTURE, BLOOD (ROUTINE X 2)  CULTURE, BLOOD (ROUTINE X 2)  MRSA PCR SCREENING  LACTIC ACID, PLASMA  LACTIC ACID, PLASMA  TSH  LIPASE, BLOOD  CREATININE, SERUM  CORTISOL  URINALYSIS COMPLETEWITH MICROSCOPIC ([facility] ONLY)  BASIC METABOLIC PANEL  CBC  THYROID PANEL WITH TSH  SODIUM    ____________________________________________  EKG  ED ECG REPORT I, QUALE, MARK, the attending physician, personally viewed and interpreted this ECG.  Date: 08/30/2015 EKG Time: 1833 Rate: 75 Rhythm: normal sinus rhythm QRS Axis: normal Intervals: normal ST/T Wave abnormalities: normal Conduction Disturbances: none Narrative Interpretation: unremarkable  ____________________________________________  RADIOLOGY      DG Abd 1 View (Final result) Result time: 08/30/15 19:21:21   Final result by Rad Results In Interface (08/30/15 19:21:21)   Narrative:   CLINICAL DATA: No bowel movement since last Thursday.  EXAM: ABDOMEN - 1 VIEW  COMPARISON: None.  FINDINGS: Large colonic stool burden without evidence of enteric obstruction. No supine evidence of pneumoperitoneum. No pneumatosis or portal venous gas.  Limited visualization of lower thorax is normal.  Moderate scoliotic curvature of the lumbar spine, convex to the left with associated moderate to severe multilevel lumbar spine DDD, incompletely evaluated.  Vascular calcifications.  IMPRESSION: Large colonic stool burden without evidence of enteric obstruction.   Electronically Signed By: Sandi Mariscal M.D. On: 08/30/2015 19:21          CT Chest Wo Contrast (Final result) Result time: 08/30/15 19:09:24   Final result by Rad Results In Interface (08/30/15 19:09:24)   Narrative:   CLINICAL DATA: Diagnosed with pneumonia April 27th, start antibiotics, elevated white blood count  EXAM: CT CHEST WITHOUT CONTRAST  TECHNIQUE: Multidetector CT imaging of the chest was performed following the standard protocol without IV contrast.  COMPARISON: 08/23/2015  FINDINGS: Moderate diffuse centrilobular emphysema. As expected this process is predominant in the mid to upper lung zones particularly in the apices.  Irregular apical opacity bilaterally likely represents pleural thickening. There  is mild bilateral bronchitic change. The right lung is clear otherwise.  On the left, there is consolidation in the medial lingula with air bronchograms. This area measures about 5.1 x 3.9 cm. The consolidative process extends into the left hilum. There is some narrowing of left upper lobe bronchus. Mild consolidation in the inferior tip of the lingula is likely atelectasis. There is mild dependent atelectasis in the left lower lobe.  There is sub carinal adenopathy measuring up to 15 mm. An AP window lymph node measures 10 mm and another AP window lymph node measures 13 mm. There is a pretracheal lymph node that measures 11 mm.  There is a small left  pleural effusion. There is calcification of the aorta and of the coronary arteries. There is no pericardial effusion.  Images through the upper abdomen demonstrate innumerable low-attenuation liver masses measuring up to about 2.3 cm. There are no acute musculoskeletal findings.  IMPRESSION: Consolidation in the medial lingula may indicate pneumonia. However, the possibility of postobstructive pneumonitis is suspected as the contours are concerning for the possibility of left perihilar mass.  Mediastinal adenopathy may be metastatic.  The presence of numerous low-attenuation liver lesions, with attenuation values not consistent with cysts, is concerning for the presence of metastatic disease and increases the degree of suspicion for the left perihilar process.  Small left pleural effusion.   Electronically Signed By: Skipper Cliche M.D. On: 08/30/2015 19:09          CT Head Wo Contrast (Final result) Result time: 08/30/15 19:02:57   Final result by Rad Results In Interface (08/30/15 19:02:57)   Narrative:   CLINICAL DATA: History of bladder cancer. Current smoker. History TIA.  EXAM: CT HEAD WITHOUT CONTRAST  TECHNIQUE: Contiguous axial images were obtained from the base of the skull through the vertex  without intravenous contrast.  COMPARISON: None.  FINDINGS: Scattered periventricular hypodensities compatible microvascular ischemic disease. 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 Given background parenchymal abnormalities, there is no CT evidence of superimposed acute large territory infarct. No intraparenchymal or extra-axial mass or hemorrhage. Normal size and configuration of the ventricles and basilar cisterns. No midline shift. Intracranial atherosclerosis. Limited visualization of the paranasal sinuses and mastoid air cells is normal. No air-fluid levels. Regional soft tissues appear normal. Post bilateral cataract surgery. No displaced calvarial fracture.  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   Electronically Signed By: Sandi Mariscal M.D. On: 08/30/2015 19:02       ____________________________________________   PROCEDURES  Procedure(s) performed: None  Critical Care performed: Yes, see critical care note(s) CRITICAL CARE Performed by: Delman Kitten   Total critical care time: 40 minutes  Critical care time was exclusive of separately billable procedures and treating other patients.  Critical care was necessary to treat or prevent imminent or life-threatening deterioration.  Critical care was time spent personally by me on the following activities: development of treatment plan with patient and/or surrogate as well as nursing, discussions with consultants, evaluation of patient's response to treatment, examination of patient, obtaining history from patient or surrogate, ordering and performing treatments and interventions, ordering and review of laboratory studies, ordering and review of radiographic studies, pulse oximetry and re-evaluation of patient's condition.  Severe hyponatremia, requiring admission. Has associated mild somnolence/altered mental status associated. Patient required critical  intervention including slow IV fluid administration, and admission ____________________________________________   INITIAL IMPRESSION / ASSESSMENT AND PLAN / ED COURSE  Pertinent labs & imaging results that were available during my care of the patient were reviewed by me and considered in my medical decision making (see chart for details).  Cough about 2 weeks, not improving with antibiotics. Not becoming sleepy, slightly confused per family. He hasn't noted acute distress, however I'm concerned about the possibility of possible underlying malignancy versus medication side effect, versus other acute metabolic cause for his severe hyponatremia. The patient will require admission. He was repleted with slow small amount of saline here, and discussed the case with the hospitalist service will admit the patient.  Discussed the patient and his family, will be admitted for further workup and evaluation. No clear evidence of infectious etiology my exam at this time, though  I am sending blood cultures and will defer to the hospitalist for treatment regarding any antibiotics. ____________________________________________   FINAL CLINICAL IMPRESSION(S) / ED DIAGNOSES  Final diagnoses:  Hyponatremia  Altered mental status, unspecified altered mental status type  Liver mass  Lung mass      Delman Kitten, MD 08/30/15 2325

## 2015-08-30 NOTE — ED Notes (Signed)
Pt given foot tray at this time. Family at bedside. No change in patient condition.

## 2015-08-30 NOTE — Telephone Encounter (Signed)
Pt saw Simona Huh on Monday 08/27/15 for constipation. Pt hasn't had a BM since Thursday 08/23/15. Pt did have a very small BM this morning but he had to strain to get it out and it was very small. Pt's wife would like to be advised if they should continue medications as directed or what they should try. Pt's wife asked if this could be caused by his low sodium level. Wife would like a nurse to return her call. Please advise. Thanks TNP

## 2015-08-30 NOTE — Telephone Encounter (Signed)
Patient's wife was notified.  

## 2015-08-30 NOTE — Telephone Encounter (Signed)
Patients wife Margaretha Sheffield called back and was advised as below. She wants to wait until the morning to have the x ray done. She states patient took Doculax this morning and the label instructions are for 3 times a day. She plans to have patient take another dose of Doculax now, and another at supper time. She wants to try this before the x ray. She states she would call us back in the morning with an update and let us know if he is going to get the x ray. We will call patient in the morning to check on patient .

## 2015-08-30 NOTE — ED Notes (Signed)
Pt dx with pneumonia on 4/27; 5/1 started on antibiotics for 2nd time in 2 weeks. Pt's PCP called yesterday and reported pt with low sodium level and high white blood cell count. Pt also started Chantix 8 days ago. Pt reports he can't sleep at night

## 2015-08-30 NOTE — H&P (Signed)
[facility] [place] NAME: Bruce May    MR#:  [id number]  DATE OF BIRTH:  November 25, 1940  DATE OF ADMISSION:  08/30/2015  PRIMARY CARE PHYSICIAN: Lelon Huh, MD   REQUESTING/REFERRING PHYSICIAN: Dr Jacqualine Code  CHIEF COMPLAINT:   weakness HISTORY OF PRESENT ILLNESS:  Bruce May  is a 75 y.o. male with a known history of Tobacco dependence, hypertension and COPD who presents with above complaint. Over the past several weeks patient had increasing weakness. He has been treated for community-acquired pneumonia with Levaquin. He was initially treated for bronchitis with Levaquin and then found to have pneumonia by his outpatient primary care physician. Despite being treated for pneumonia he continues to have weakness. Lab work in the emergency department reveals a sodium level of 115. He has recently stopped smoking and was prescribed Chantix.  PAST MEDICAL HISTORY:   Past Medical History  Diagnosis Date  . Malignant neoplasm of bladder ([place])   . Hyperthyroidism   . Nodular prostate with urinary retention   . TIA (transient ischemic attack)   . Acid reflux   . Asthma   . Hypertension   . 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 Temporary cerebral vascular dysfunction 07/29/2014  . Airway hyperreactivity 07/29/2014  . 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 COPD, mild ([place]) 10/11/2014    PAST SURGICAL HISTORY:   Past Surgical History  Procedure Laterality Date  . Bladder surgery  2012  . Back surgery      SOCIAL HISTORY:   Social History  Substance Use Topics  . Smoking status: Current Every Day Smoker -- 1.00 packs/day    Types: Cigarettes  . Smokeless tobacco: Not on file  . Alcohol Use: No    FAMILY HISTORY:   Family History  Problem Relation Age of Onset  . Kidney disease Sister   . Diabetes Sister   . Bladder Cancer Neg Hx   . Prostate cancer Neg Hx     DRUG ALLERGIES:  No Known  Allergies  REVIEW OF SYSTEMS:   Review of Systems  Constitutional: Positive for weight loss and malaise/fatigue. Negative for fever and chills.  HENT: Negative for ear discharge, ear pain, hearing loss, nosebleeds and sore throat.   Eyes: Negative for blurred vision and pain.  Respiratory: Negative for cough, hemoptysis, shortness of breath and wheezing.   Cardiovascular: Negative for chest pain, palpitations and leg swelling.  Gastrointestinal: Positive for constipation. Negative for nausea, vomiting, abdominal pain, diarrhea and blood in stool.  Genitourinary: Negative for dysuria.  Musculoskeletal: Negative for back pain.  Neurological: Positive for weakness. Negative for dizziness, tremors, speech change, focal weakness, seizures and headaches.  Endo/Heme/Allergies: Does not bruise/bleed easily.  Psychiatric/Behavioral: Negative for depression, suicidal ideas and hallucinations.    MEDICATIONS AT HOME:   Prior to Admission medications   Medication Sig Start Date End Date Taking? Authorizing Provider  albuterol (PROVENTIL HFA;VENTOLIN HFA) 108 (90 BASE) MCG/ACT inhaler Inhale 2 puffs into the lungs 4 (four) times daily as needed.    Yes Historical Provider, MD  aspirin 325 MG EC tablet Take 325 mg by mouth daily.   Yes Historical Provider, MD  cetirizine (ZYRTEC) 10 MG tablet Take 10 mg by mouth daily.   Yes Historical Provider, MD  CHANTIX STARTING MONTH PAK 0.5 MG X 11 & 1 MG X 42 tablet  08/21/15  Yes Historical Provider, MD  fenofibrate 160 MG tablet Take 1 tablet (160  mg total) by mouth daily. 06/12/15  Yes Dennis E Chrismon, PA  fenoprofen (NALFON) 600 MG TABS tablet Take 600 mg by mouth daily.   Yes Historical Provider, MD  finasteride (PROSCAR) 5 MG tablet Take 1 tablet (5 mg total) by mouth daily. 12/07/14  Yes Collier Flowers, MD  fluticasone furoate-vilanterol (BREO ELLIPTA) 200-25 MCG/INH AEPB Inhale 2 puffs into the lungs daily.   Yes Historical Provider, MD   HYDROcodone-acetaminophen (NORCO/VICODIN) 5-325 MG tablet Take 1 tablet by mouth every 6 (six) hours as needed.  08/15/15 09/14/15 Yes Historical Provider, MD  levofloxacin (LEVAQUIN) 750 MG tablet Take 750 mg by mouth daily.  08/21/15  Yes Historical Provider, MD  Multiple Vitamin (MULTIVITAMIN WITH MINERALS) TABS tablet Take 1 tablet by mouth daily.   Yes Historical Provider, MD  nystatin (MYCOSTATIN) 100000 UNIT/ML suspension Take 5 mLs by mouth 4 (four) times daily.   Yes Historical Provider, MD  Omega-3 Fatty Acids (FISH OIL EXTRA STRENGTH) 1200 MG CAPS Take by mouth.   Yes Historical Provider, MD  omeprazole (PRILOSEC) 40 MG capsule TAKE ONE CAPSULE BY MOUTH ONCE DAILY AS NEEDED 08/16/15  Yes Dennis E Chrismon, PA  pravastatin (PRAVACHOL) 80 MG tablet TAKE ONE TABLET BY MOUTH ONCE DAILY 03/15/15  Yes Birdie Sons, MD  predniSONE (STERAPRED UNI-PAK 21 TAB) 10 MG (21) TBPK tablet Take 10 mg by mouth daily.   Yes Historical Provider, MD  tiotropium (SPIRIVA) 18 MCG inhalation capsule Place 18 mcg into inhaler and inhale 2 (two) times daily.    Yes Historical Provider, MD      VITAL SIGNS:  Blood pressure 152/88, pulse 75, temperature 98.1 F (36.7 C), temperature source Oral, resp. rate 15, height '5\' 9"'$  (1.753 m), weight 68.04 kg (150 lb), SpO2 97 %.  PHYSICAL EXAMINATION:   Physical Exam  Constitutional: He is oriented to person, place, and time and well-developed, well-nourished, and in no distress. No distress.  HENT:  Head: Normocephalic.  Eyes: No scleral icterus.  Neck: Normal range of motion. Neck supple. No JVD present. No tracheal deviation present.  Cardiovascular: Normal rate, regular rhythm and normal heart sounds.  Exam reveals no gallop and no friction rub.   No murmur heard. Pulmonary/Chest: Effort normal and breath sounds normal. No respiratory distress. He has no wheezes. He has no rales. He exhibits no tenderness.  Abdominal: Soft. Bowel sounds are normal. He exhibits  no distension and no mass. There is no tenderness. There is no rebound and no guarding.  Musculoskeletal: Normal range of motion. He exhibits no edema.  Neurological: He is alert and oriented to person, place, and time.  Skin: Skin is warm and dry. No rash noted. No erythema.  Psychiatric: Affect and judgment normal.      LABORATORY PANEL:   CBC  Recent Labs Lab 08/30/15 1746  WBC 14.2*  HGB 10.6*  HCT 30.7*  PLT 438   ------------------------------------------------------------------------------------------------------------------  Chemistries   Recent Labs Lab 08/30/15 1746  NA 115*  K 4.9  CL 86*  CO2 21*  GLUCOSE 121*  BUN 19  CREATININE 0.81  CALCIUM 8.4*  AST 41  ALT 23  ALKPHOS 58  BILITOT 0.6   ------------------------------------------------------------------------------------------------------------------  Cardiac Enzymes No results for input(s): TROPONINI in the last 168 hours. ------------------------------------------------------------------------------------------------------------------  RADIOLOGY:  Dg Abd 1 View  08/30/2015  CLINICAL DATA:  No bowel movement since last Thursday. EXAM: ABDOMEN - 1 VIEW COMPARISON:  None. FINDINGS: Large colonic stool burden without evidence of enteric obstruction. No  supine evidence of pneumoperitoneum. No pneumatosis or portal venous gas. Limited visualization of lower thorax is normal. Moderate scoliotic curvature of the lumbar spine, convex to the left with associated moderate to severe multilevel lumbar spine DDD, incompletely evaluated. Vascular calcifications. IMPRESSION: Large colonic stool burden without evidence of enteric obstruction. Electronically Signed   By: Sandi Mariscal M.D.   On: 08/30/2015 19:21   Ct Head Wo Contrast  08/30/2015  CLINICAL DATA:  History of bladder cancer. Current smoker. History TIA. EXAM: CT HEAD WITHOUT CONTRAST TECHNIQUE: Contiguous axial images were obtained from the base of the  skull through the vertex without intravenous contrast. COMPARISON:  None. FINDINGS: Scattered periventricular hypodensities compatible microvascular ischemic disease. 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 Given background parenchymal abnormalities, there is no CT evidence of superimposed acute large territory infarct. No intraparenchymal or extra-axial mass or hemorrhage. Normal size and configuration of the ventricles and basilar cisterns. No midline shift. Intracranial atherosclerosis. Limited visualization of the paranasal sinuses and mastoid air cells is normal. No air-fluid levels. Regional soft tissues appear normal. Post bilateral cataract surgery. No displaced calvarial fracture. 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 Electronically Signed   By: Sandi Mariscal M.D.   On: 08/30/2015 19:02   Ct Chest Wo Contrast  08/30/2015  CLINICAL DATA:  Diagnosed with pneumonia April 27th, start antibiotics, elevated white blood count EXAM: CT CHEST WITHOUT CONTRAST TECHNIQUE: Multidetector CT imaging of the chest was performed following the standard protocol without IV contrast. COMPARISON:  08/23/2015 FINDINGS: Moderate diffuse centrilobular emphysema. As expected this process is predominant in the mid to upper lung zones particularly in the apices. Irregular apical opacity bilaterally likely represents pleural thickening. There is mild bilateral bronchitic change. The right lung is clear otherwise. On the left, there is consolidation in the medial lingula with air bronchograms. This area measures about 5.1 x 3.9 cm. The consolidative process extends into the left hilum. There is some narrowing of left upper lobe bronchus. Mild consolidation in the inferior tip of the lingula is likely atelectasis. There is mild dependent atelectasis in the left lower lobe. There is sub carinal adenopathy measuring up to 15 mm. An AP window lymph node measures 10 mm and another  AP window lymph node measures 13 mm. There is a pretracheal lymph node that measures 11 mm. There is a small left pleural effusion. There is calcification of the aorta and of the coronary arteries. There is no pericardial effusion. Images through the upper abdomen demonstrate innumerable low-attenuation liver masses measuring up to about 2.3 cm. There are no acute musculoskeletal findings. IMPRESSION: Consolidation in the medial lingula may indicate pneumonia. However, the possibility of postobstructive pneumonitis is suspected as the contours are concerning for the possibility of left perihilar mass. Mediastinal adenopathy may be metastatic. The presence of numerous low-attenuation liver lesions, with attenuation values not consistent with cysts, is concerning for the presence of metastatic disease and increases the degree of suspicion for the left perihilar process. Small left pleural effusion. Electronically Signed   By: Skipper Cliche M.D.   On: 08/30/2015 19:09    EKG:     IMPRESSION AND PLAN:   75 year old male with COPD and tobacco dependence who recently stopped smoking who presents with weakness and found to have hyponatremia.  1. Severe hyponatremia: This may be multifactorial with decreased by mouth intake and possible SIADH. Continue with IV fluids and monitor sodium every 6 hours. If sodium level decreases with IV fluids and  stop IV fluids. Check uric acid level and TSH. Nephrology consultation.  2. Abnormal CT scan concerning for possible lung cancer with recent treatment for pneumonia: Continue Levaquin for 2 more days as prescribed by patient's primary care physician. Consult patient's pulmonologist, Dr. Humphrey Rolls. He may need to undergo bronchoscopy for further evaluation for possible lung mass.  3. COPD: Patient is on a steroid taper prescribed by his PCP and will continue this. Continue inhalers. 4. Tobacco dependence: Patient is currently on Chantix. It is unclear if this could  be contributed to hyponatremia and therefore have discontinued this for now.  5. BPH: Continue Proscar  All the records are reviewed and case discussed with ED provider. Management plans discussed with the patient and he in agreement  CODE STATUS: FULL  Critical care TOTAL TIME TAKING CARE OF THIS PATIENT: 54 minutes.   Patient was severe hyponatremia which could lead to cardiac arrest. Patient will be admitted to stepdown unit Anthon Harpole M.D on 08/30/2015 at 8:07 PM  Between 7am to 6pm - Pager - [phone]  After 6pm go to [email or web address] - password [email or web address] [facility]  Office  [phone]  CC: Primary care physician; Lelon Huh, MD

## 2015-08-31 ENCOUNTER — Inpatient Hospital Stay: Payer: Medicare Other

## 2015-08-31 DIAGNOSIS — J449 Chronic obstructive pulmonary disease, unspecified: Secondary | ICD-10-CM

## 2015-08-31 DIAGNOSIS — Z8673 Personal history of transient ischemic attack (TIA), and cerebral infarction without residual deficits: Secondary | ICD-10-CM

## 2015-08-31 DIAGNOSIS — Z7952 Long term (current) use of systemic steroids: Secondary | ICD-10-CM

## 2015-08-31 DIAGNOSIS — F17210 Nicotine dependence, cigarettes, uncomplicated: Secondary | ICD-10-CM

## 2015-08-31 DIAGNOSIS — J189 Pneumonia, unspecified organism: Secondary | ICD-10-CM

## 2015-08-31 DIAGNOSIS — R531 Weakness: Secondary | ICD-10-CM

## 2015-08-31 DIAGNOSIS — E871 Hypo-osmolality and hyponatremia: Secondary | ICD-10-CM

## 2015-08-31 DIAGNOSIS — J45909 Unspecified asthma, uncomplicated: Secondary | ICD-10-CM

## 2015-08-31 DIAGNOSIS — N403 Nodular prostate with lower urinary tract symptoms: Secondary | ICD-10-CM

## 2015-08-31 DIAGNOSIS — J439 Emphysema, unspecified: Secondary | ICD-10-CM

## 2015-08-31 DIAGNOSIS — E222 Syndrome of inappropriate secretion of antidiuretic hormone: Principal | ICD-10-CM

## 2015-08-31 DIAGNOSIS — R5383 Other fatigue: Secondary | ICD-10-CM

## 2015-08-31 DIAGNOSIS — R630 Anorexia: Secondary | ICD-10-CM

## 2015-08-31 DIAGNOSIS — K219 Gastro-esophageal reflux disease without esophagitis: Secondary | ICD-10-CM

## 2015-08-31 DIAGNOSIS — Z79899 Other long term (current) drug therapy: Secondary | ICD-10-CM

## 2015-08-31 DIAGNOSIS — E0590 Thyrotoxicosis, unspecified without thyrotoxic crisis or storm: Secondary | ICD-10-CM

## 2015-08-31 DIAGNOSIS — Z8551 Personal history of malignant neoplasm of bladder: Secondary | ICD-10-CM

## 2015-08-31 DIAGNOSIS — Z7982 Long term (current) use of aspirin: Secondary | ICD-10-CM

## 2015-08-31 DIAGNOSIS — K769 Liver disease, unspecified: Secondary | ICD-10-CM

## 2015-08-31 DIAGNOSIS — R634 Abnormal weight loss: Secondary | ICD-10-CM

## 2015-08-31 DIAGNOSIS — J90 Pleural effusion, not elsewhere classified: Secondary | ICD-10-CM

## 2015-08-31 DIAGNOSIS — I10 Essential (primary) hypertension: Secondary | ICD-10-CM

## 2015-08-31 DIAGNOSIS — R918 Other nonspecific abnormal finding of lung field: Secondary | ICD-10-CM

## 2015-08-31 LAB — BASIC METABOLIC PANEL
Anion gap: 8 (ref 5–15)
BUN: 19 mg/dL (ref 6–20)
CALCIUM: 7.8 mg/dL — AB (ref 8.9–10.3)
CO2: 20 mmol/L — ABNORMAL LOW (ref 22–32)
Chloride: 89 mmol/L — ABNORMAL LOW (ref 101–111)
Creatinine, Ser: 0.79 mg/dL (ref 0.61–1.24)
Glucose, Bld: 100 mg/dL — ABNORMAL HIGH (ref 65–99)
POTASSIUM: 4 mmol/L (ref 3.5–5.1)
SODIUM: 117 mmol/L — AB (ref 135–145)

## 2015-08-31 LAB — CBC
HEMATOCRIT: 30.5 % — AB (ref 40.0–52.0)
HEMOGLOBIN: 10.6 g/dL — AB (ref 13.0–18.0)
MCH: 30.5 pg (ref 26.0–34.0)
MCHC: 34.8 g/dL (ref 32.0–36.0)
MCV: 87.6 fL (ref 80.0–100.0)
Platelets: 426 10*3/uL (ref 150–440)
RBC: 3.49 MIL/uL — AB (ref 4.40–5.90)
RDW: 13.7 % (ref 11.5–14.5)
WBC: 13.1 10*3/uL — ABNORMAL HIGH (ref 3.8–10.6)

## 2015-08-31 LAB — URINALYSIS COMPLETE WITH MICROSCOPIC (ARMC ONLY)
BACTERIA UA: NONE SEEN
Bilirubin Urine: NEGATIVE
Glucose, UA: NEGATIVE mg/dL
HGB URINE DIPSTICK: NEGATIVE
Ketones, ur: NEGATIVE mg/dL
Leukocytes, UA: NEGATIVE
Nitrite: NEGATIVE
Protein, ur: >500 mg/dL — AB
SPECIFIC GRAVITY, URINE: 1.018 (ref 1.005–1.030)
SQUAMOUS EPITHELIAL / LPF: NONE SEEN
pH: 6 (ref 5.0–8.0)

## 2015-08-31 LAB — SODIUM
SODIUM: 116 mmol/L — AB (ref 135–145)
SODIUM: 114 mmol/L — AB (ref 135–145)
Sodium: 119 mmol/L — CL (ref 135–145)
Sodium: 116 mmol/L — CL (ref 135–145)

## 2015-08-31 LAB — CORTISOL
CORTISOL PLASMA: 6.7 ug/dL
CORTISOL PLASMA: 16.2 ug/dL

## 2015-08-31 LAB — OSMOLALITY, URINE: OSMOLALITY UR: 691 mosm/kg (ref 300–900)

## 2015-08-31 LAB — TSH: TSH: 2.471 u[IU]/mL (ref 0.350–4.500)

## 2015-08-31 LAB — SODIUM, URINE, RANDOM: Sodium, Ur: 136 mmol/L

## 2015-08-31 LAB — URIC ACID: Uric Acid, Serum: 2.7 mg/dL — ABNORMAL LOW (ref 4.4–7.6)

## 2015-08-31 LAB — OSMOLALITY: OSMOLALITY: 246 mosm/kg — AB (ref 275–295)

## 2015-08-31 MED ORDER — ENSURE ENLIVE PO LIQD
237.0000 mL | Freq: Two times a day (BID) | ORAL | Status: DC
  Administered 2015-08-31 – 2015-09-03 (×5): 237 mL via ORAL

## 2015-08-31 MED ORDER — SODIUM CHLORIDE 3 % IV SOLN
INTRAVENOUS | Status: DC
  Administered 2015-08-31: 16:00:00 30 mL/h via INTRAVENOUS
  Administered 2015-09-01 – 2015-09-02 (×2): 20 mL/h via INTRAVENOUS
  Filled 2015-08-31 (×4): qty 500

## 2015-08-31 NOTE — Evaluation (Signed)
Physical Therapy Evaluation Patient Details Name: Bruce May MRN: [id number] DOB: December 17, 1940 Today's Date: 08/31/2015   History of Present Illness  75 yo M presented to ED due to abnormal lab work including low sodium. He was found to have hyponatremia. PMH includes malignant neoplasm of bladder, COPD, temporary cerebral dysfunction, and tobacco dependence.  Clinical Impression  Pt demonstrated generalized weakness and difficulty walking. Per his family he has had a decrease in activity lately and gotten weaker. He is mod I for bed mobility. Pt requires min guard for transfers and ambulation up to 225 ft without AD. Pt is somewhat unsteady during ambulation and would benefit from the use of a FWW for increased stability and reduced risk of falls. HHPT recommended after hospital discharge to address deficits of strength, endurance, balance and gait to progress towards PLOF. Pt will benefit from skilled PT services to increase functional I and mobility for safe discharge.     Follow Up Recommendations Home health PT;Supervision for mobility/OOB    Equipment Recommendations  None recommended by PT (pt has recommended FWW)    Recommendations for Other Services       Precautions / Restrictions Precautions Precautions: Fall Restrictions Weight Bearing Restrictions: No      Mobility  Bed Mobility Overal bed mobility: Modified Independent             General bed mobility comments: increased time, uses rail  Transfers Overall transfer level: Needs assistance Equipment used: None Transfers: Sit to/from Stand;Stand Pivot Transfers Sit to Stand: Min guard Stand pivot transfers: Min guard       General transfer comment: cues for hand placement  Ambulation/Gait Ambulation/Gait assistance: Min guard Ambulation Distance (Feet): 225 Feet Assistive device: None Gait Pattern/deviations: Decreased stride length;Scissoring;Drifts right/left;Narrow base of support     General  Gait Details: Demonstrated unsteadiness without complete LOB. Cues for increased BOS to increase stability. Fair speed. After ambulation SpO2 desat to 86% on RA, but increased to 92% with seated rest and pursed lip breathing within 1-2 minutes.  Stairs            Wheelchair Mobility    Modified Rankin (Stroke Patients Only)       Balance Overall balance assessment: History of Falls;Needs assistance Sitting-balance support: No upper extremity supported Sitting balance-Leahy Scale: Good     Standing balance support: No upper extremity supported Standing balance-Leahy Scale: Fair Standing balance comment: unsteadiness with dynamic standing balance                             Pertinent Vitals/Pain Pain Assessment: No/denies pain    Home Living Family/patient expects to be discharged to:: Private residence Living Arrangements: Spouse/significant other Available Help at Discharge: Family;Available 24 hours/day Type of Home: House Home Access: Stairs to enter Entrance Stairs-Rails: Can reach both Entrance Stairs-Number of Steps: 4 Home Layout: One level Home Equipment: Walker - 2 wheels;Other (comment) (walking stick)      Prior Function Level of Independence: Independent         Comments: Pt was fairly active at home, but has recently had a decrease in activity level.     Hand Dominance        Extremity/Trunk Assessment   Upper Extremity Assessment: Generalized weakness           Lower Extremity Assessment: Generalized weakness (grossly 4/5)         Communication   Communication: HOH  Cognition Arousal/Alertness: Awake/alert  Behavior During Therapy: WFL for tasks assessed/performed Overall Cognitive Status: Within Functional Limits for tasks assessed                      General Comments      Exercises Other Exercises Other Exercises: B LE therex: supine: ankle pumps, heel slides, hip abd slides; seated LAQs, marching, and  hip abd with manual resistance x10 each. Cues for staying on task and proper technique of exercises.      Assessment/Plan    PT Assessment Patient needs continued PT services  PT Diagnosis Difficulty walking;Generalized weakness   PT Problem List Decreased strength;Decreased activity tolerance;Decreased balance;Decreased mobility;Cardiopulmonary status limiting activity  PT Treatment Interventions Gait training;Stair training;DME instruction;Therapeutic activities;Therapeutic exercise;Balance training;Neuromuscular re-education;Patient/family education   PT Goals (Current goals can be found in the Care Plan section) Acute Rehab PT Goals Patient Stated Goal: to get home PT Goal Formulation: With patient Time For Goal Achievement: 09/14/15 Potential to Achieve Goals: Fair    Frequency Min 2X/week   Barriers to discharge Inaccessible home environment stairs to enter    Co-evaluation               End of Session Equipment Utilized During Treatment: Gait belt Activity Tolerance: Patient tolerated treatment well;Patient limited by fatigue Patient left: in chair;with call bell/phone within reach;with chair alarm set;with family/visitor present Nurse Communication: Mobility status         Time: 1030-1314 PT Time Calculation (min) (ACUTE ONLY): 24 min   Charges:   PT Evaluation $PT Eval Moderate Complexity: 1 Procedure PT Treatments $Therapeutic Exercise: 8-22 mins   PT G Codes:        Neoma Laming, PT, DPT  08/31/2015, 9:57 AM [phone]

## 2015-08-31 NOTE — Progress Notes (Signed)
Pt admitted to this unit (Oncology).  Telemetry applied and called to centralized tele.  Skin check negative for skin breakdown.  Pt and wife oriented to unit.  IV fluids restarted.  Instructed pt/wife on need for urine sample and vial in room. Dorna Bloom RN

## 2015-08-31 NOTE — Care Management Important Message (Signed)
Important Message  Patient Details  Name: Bruce May MRN: [id number] Date of Birth: December 04, 1940   Medicare Important Message Given:  Yes    Shelbie Ammons, RN 08/31/2015, 1:19 PM

## 2015-08-31 NOTE — Consult Note (Signed)
[facility] CONSULT NOTE    Date: 08/31/2015                  Patient Name:  Bruce May  MRN: [id number]  DOB: Aug 24, 1940  Age / Sex: 75 y.o., male         PCP: Lelon Huh, MD                 Service Requesting Consult: Dr. Benjie Karvonen                 Reason for Consult: Hyponatremia in setting of lung mass            History of Present Illness: Patient is a 75 y.o. male with a PMHx of bladder cancer, hyperthyroidism, history of TIA, asthma, hypertension, COPD long-standing tobacco abuse, who was admitted to [facility] on 08/30/2015 for evaluation of generalized weakness.  He patient was recently treated for a community-acquired pneumonia with Levaquin. CT chest was performed which showed consolidation in the medial lingula.  This raised the possibility of postobstructive pneumonitis.  There were contours that were concerning for the possibility of a left perihilar mass.  In addition there was mediastinal adenopathy.  There were also numerous low attenuation liver lesions which were not entirely consistent with cysts.  Therefore there was some concern for underlying lung cancer given his extensive tobacco abuse history.  We are consulted for the evaluation management of hyponatremia.  His baseline serum sodium is 143 from 06/07/15.  When he presented serum sodium was 117.  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    Medications: Outpatient medications: Prescriptions prior to admission  Medication Sig Dispense Refill Last Dose  . albuterol (PROVENTIL HFA;VENTOLIN HFA) 108 (90 BASE) MCG/ACT inhaler Inhale 2 puffs into the lungs 4 (four) times daily as needed.    08/30/2015 at Unknown time  . aspirin 325 MG EC tablet Take 325 mg by mouth daily.   08/30/2015 at Unknown time  . cetirizine (ZYRTEC) 10 MG tablet Take 10 mg by mouth daily.   Past Week at Unknown time  . CHANTIX STARTING MONTH PAK 0.5 MG X 11 & 1 MG X 42 tablet    unknown at unknown  . fenofibrate 160 MG  tablet Take 1 tablet (160 mg total) by mouth daily. 90 tablet 3 08/30/2015 at unknown  . fenoprofen (NALFON) 600 MG TABS tablet Take 600 mg by mouth daily.   08/30/2015 at Unknown time  . finasteride (PROSCAR) 5 MG tablet Take 1 tablet (5 mg total) by mouth daily. 90 tablet 3 08/30/2015 at Unknown time  . fluticasone furoate-vilanterol (BREO ELLIPTA) 200-25 MCG/INH AEPB Inhale 2 puffs into the lungs daily.   Past Week at Unknown time  . HYDROcodone-acetaminophen (NORCO/VICODIN) 5-325 MG tablet Take 1 tablet by mouth every 6 (six) hours as needed.    prn at prn  . levofloxacin (LEVAQUIN) 750 MG tablet Take 750 mg by mouth daily.    08/30/2015 at Unknown time  . Multiple Vitamin (MULTIVITAMIN WITH MINERALS) TABS tablet Take 1 tablet by mouth daily.   08/30/2015 at Unknown time  . nystatin (MYCOSTATIN) 100000 UNIT/ML suspension Take 5 mLs by mouth 4 (four) times daily.   Past Week at Unknown time  . Omega-3 Fatty Acids (FISH OIL EXTRA STRENGTH) 1200 MG CAPS Take by mouth.   08/30/2015 at Unknown time  . omeprazole (PRILOSEC) 40 MG capsule TAKE ONE CAPSULE BY MOUTH ONCE DAILY AS NEEDED 90 capsule 0 08/29/2015  at Unknown time  . pravastatin (PRAVACHOL) 80 MG tablet TAKE ONE TABLET BY MOUTH ONCE DAILY 90 tablet 3 08/30/2015 at Unknown time  . predniSONE (STERAPRED UNI-PAK 21 TAB) 10 MG (21) TBPK tablet Take 10 mg by mouth daily.   unknown at unknown  . tiotropium (SPIRIVA) 18 MCG inhalation capsule Place 18 mcg into inhaler and inhale 2 (two) times daily.    Past Week at Unknown time    Current medications: Current Facility-Administered Medications  Medication Dose Route Frequency Provider Last Rate Last Dose  . 0.9 %  sodium chloride infusion   Intravenous Continuous Bettey Costa, MD 50 mL/hr at 08/31/15 0300    . acetaminophen (TYLENOL) tablet 650 mg  650 mg Oral Q6H PRN Bettey Costa, MD       Or  . acetaminophen (TYLENOL) suppository 650 mg  650 mg Rectal Q6H PRN Bettey Costa, MD      . aspirin EC tablet 325 mg  325 mg  Oral Daily Bettey Costa, MD   325 mg at 08/31/15 0956  . bisacodyl (DULCOLAX) suppository 10 mg  10 mg Rectal Daily Bettey Costa, MD   10 mg at 08/31/15 0037  . enoxaparin (LOVENOX) injection 40 mg  40 mg Subcutaneous Q24H Bettey Costa, MD   40 mg at 08/30/15 2217  . feeding supplement (ENSURE ENLIVE) (ENSURE ENLIVE) liquid 237 mL  237 mL Oral BID BM Gladstone Lighter, MD      . fenofibrate tablet 160 mg  160 mg Oral Daily Bettey Costa, MD   160 mg at 08/31/15 0956  . finasteride (PROSCAR) tablet 5 mg  5 mg Oral Daily Bettey Costa, MD   5 mg at 08/31/15 0956  . fluticasone furoate-vilanterol (BREO ELLIPTA) 200-25 MCG/INH 2 puff  2 puff Inhalation Daily Bettey Costa, MD   2 puff at 08/31/15 0833  . HYDROcodone-acetaminophen (NORCO/VICODIN) 5-325 MG per tablet 1-2 tablet  1-2 tablet Oral Q4H PRN Bettey Costa, MD   1 tablet at 08/31/15 0038  . lactulose (CHRONULAC) 10 GM/15ML solution 30 g  30 g Oral BID Bettey Costa, MD   30 g at 08/31/15 0956  . levofloxacin (LEVAQUIN) tablet 750 mg  750 mg Oral Daily Bettey Costa, MD   750 mg at 08/31/15 0956  . loratadine (CLARITIN) tablet 10 mg  10 mg Oral Daily Bettey Costa, MD   10 mg at 08/31/15 0956  . multivitamin with minerals tablet 1 tablet  1 tablet Oral Daily Bettey Costa, MD   1 tablet at 08/31/15 0956  . ondansetron (ZOFRAN) tablet 4 mg  4 mg Oral Q6H PRN Bettey Costa, MD       Or  . ondansetron (ZOFRAN) injection 4 mg  4 mg Intravenous Q6H PRN Sital Mody, MD      . pantoprazole (PROTONIX) EC tablet 40 mg  40 mg Oral Daily Bettey Costa, MD   40 mg at 08/31/15 0956  . polyethylene glycol (MIRALAX / GLYCOLAX) packet 17 g  17 g Oral Daily Bettey Costa, MD   17 g at 08/31/15 0956  . pravastatin (PRAVACHOL) tablet 80 mg  80 mg Oral Daily Bettey Costa, MD   80 mg at 08/31/15 0956  . predniSONE (STERAPRED UNI-PAK 21 TAB) tablet 10 mg  10 mg Oral Daily Bettey Costa, MD   10 mg at 08/31/15 1022  . senna (SENOKOT) tablet 8.6 mg  1 tablet Oral Daily Bettey Costa, MD   8.6 mg at 08/31/15 0956   . sodium phosphate (FLEET) 7-19  GM/118ML enema 1 enema  1 enema Rectal Once Bettey Costa, MD   1 enema at 08/30/15 2015  . tiotropium (SPIRIVA) inhalation capsule 18 mcg  18 mcg Inhalation BID Bettey Costa, MD   18 mcg at 08/31/15 7416      Allergies: No Known Allergies    Past Medical History: Past Medical History  Diagnosis Date  . Malignant neoplasm of bladder ([place])   . Hyperthyroidism   . Nodular prostate with urinary retention   . TIA (transient ischemic attack)   . Acid reflux   . Asthma   . Hypertension   . Malignant neoplasm of lateral wall of urinary bladder ([place])   . Temporary cerebral vascular dysfunction 07/29/2014  . Airway hyperreactivity 07/29/2014  . 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 COPD, mild ([place]) 10/11/2014     Past Surgical History: Past Surgical History  Procedure Laterality Date  . Bladder surgery  2012  . Back surgery       Family History: Family History  Problem Relation Age of Onset  . Kidney disease Sister   . Diabetes Sister   . Bladder Cancer Neg Hx   . Prostate cancer Neg Hx      Social History: Social History   Social History  . Marital Status: Married    Spouse Name: N/A  . Number of Children: N/A  . Years of Education: N/A   Occupational History  . Not on file.   Social History Main Topics  . Smoking status: Current Every Day Smoker -- 1.00 packs/day    Types: Cigarettes  . Smokeless tobacco: Not on file  . Alcohol Use: No  . Drug Use: No  . Sexual Activity: Not on file   Other Topics Concern  . Not on file   Social History Narrative     Review of Systems: Review of Systems  Constitutional: Positive for malaise/fatigue. Negative for fever, chills and diaphoresis.  HENT: Positive for hearing loss. Negative for nosebleeds and tinnitus.   Eyes: Negative for blurred vision, double vision and photophobia.  Respiratory: Positive for cough, sputum production and shortness of breath. Negative for  hemoptysis.   Cardiovascular: Negative for chest pain, palpitations and orthopnea.  Gastrointestinal: Negative for heartburn, vomiting and abdominal pain.  Genitourinary: Negative for dysuria, urgency and frequency.  Musculoskeletal: Negative for myalgias and neck pain.  Skin: Negative for itching and rash.  Neurological: Negative for dizziness, focal weakness, seizures, weakness and headaches.  Endo/Heme/Allergies: Negative for polydipsia. Does not bruise/bleed easily.  Psychiatric/Behavioral: Negative for suicidal ideas and hallucinations. The patient is not nervous/anxious.      Vital Signs: Blood pressure 166/83, pulse 91, temperature 97.5 F (36.4 C), temperature source Oral, resp. rate 20, height '5\' 9"'$  (1.753 m), weight 70.1 kg (154 lb 8.7 oz), SpO2 95 %.  Weight trends: Filed Weights   08/30/15 1740 08/30/15 2200  Weight: 68.04 kg (150 lb) 70.1 kg (154 lb 8.7 oz)    Physical Exam: General: NAD, sitting up in chair  Head: Normocephalic, atraumatic.  Eyes: Anicteric, EOMI  Nose: Mucous membranes moist, not inflammed, nonerythematous.  Throat: Oropharynx nonerythematous, no exudate appreciated.   Neck: Supple, trachea midline.  Lungs:  Rhonchi noted b/l, normal effort  Heart: RRR. S1 and S2 normal without gallop, murmur, or rubs.  Abdomen:  BS normoactive. Soft, Nondistended, non-tender.  No masses or organomegaly.  Extremities: No pretibial edema.  Neurologic: A&O X3, Motor strength is 5/5 in the all 4 extremities  Skin: No visible  rashes, scars.    Lab results: Basic Metabolic Panel:  Recent Labs Lab 08/27/15 1429  08/30/15 1746 08/30/15 2213 08/31/15 0500 08/31/15 0957  NA 117*  < > 115* 117* 117* 116*  K 5.1  --  4.9  --  4.0  --   CL 79*  --  86*  --  89*  --   CO2 23  --  21*  --  20*  --   GLUCOSE 98  --  121*  --  100*  --   BUN 18  --  19  --  19  --   CREATININE 1.03  --  0.81 0.83 0.79  --   CALCIUM 8.9  --  8.4*  --  7.8*  --   < > = values in  this interval not displayed.  Liver Function Tests:  Recent Labs Lab 08/27/15 1429 08/30/15 1746  AST 40 41  ALT 21 23  ALKPHOS 76 58  BILITOT 0.5 0.6  PROT 6.2 6.3*  ALBUMIN 3.4* 3.0*    Recent Labs Lab 08/30/15 1828  LIPASE 23   No results for input(s): AMMONIA in the last 168 hours.  CBC:  Recent Labs Lab 08/27/15 1429 08/30/15 1746 08/30/15 2213 08/31/15 0500  WBC 15.4* 14.2* 13.2* 13.1*  NEUTROABS 11.3*  --   --   --   HGB  --  10.6* 10.5* 10.6*  HCT 37.5 30.7* 30.1* 30.5*  MCV 90 86.8 86.9 87.6  PLT 518* 438 404 426    Cardiac Enzymes:  Recent Labs Lab 08/30/15 1828  TROPONINI 0.06*    BNP: Invalid input(s): POCBNP  CBG:  Recent Labs Lab 08/30/15 2204  GLUCAP 130*    Microbiology: Results for orders placed or performed during the hospital encounter of 08/30/15  Culture, blood (Routine X 2) w Reflex to ID Panel     Status: None (Preliminary result)   Collection Time: 08/30/15  6:27 PM  Result Value Ref Range Status   Specimen Description BLOOD LEFT ANTECUBITAL  Final   Special Requests BOTTLES DRAWN AEROBIC AND ANAEROBIC  5CC  Final   Culture NO GROWTH < 24 HOURS  Final   Report Status PENDING  Incomplete  Culture, blood (Routine X 2) w Reflex to ID Panel     Status: None (Preliminary result)   Collection Time: 08/30/15  6:28 PM  Result Value Ref Range Status   Specimen Description BLOOD RIGHT HAND  Final   Special Requests BOTTLES DRAWN AEROBIC AND ANAEROBIC  [place]  Final   Culture NO GROWTH < 24 HOURS  Final   Report Status PENDING  Incomplete  MRSA PCR Screening     Status: None   Collection Time: 08/30/15 10:01 PM  Result Value Ref Range Status   MRSA by PCR NEGATIVE NEGATIVE Final    Comment:        The GeneXpert MRSA Assay (FDA approved for NASAL specimens only), is one component of a comprehensive MRSA colonization surveillance program. It is not intended to diagnose MRSA infection nor to guide or monitor treatment  for MRSA infections.     Coagulation Studies: No results for input(s): LABPROT, INR in the last 72 hours.  Urinalysis:  Recent Labs  08/31/15 1044  COLORURINE YELLOW*  LABSPEC 1.018  PHURINE 6.0  GLUCOSEU NEGATIVE  HGBUR NEGATIVE  BILIRUBINUR NEGATIVE  KETONESUR NEGATIVE  PROTEINUR >500*  NITRITE NEGATIVE  LEUKOCYTESUR NEGATIVE      Imaging: Dg Abd 1 View  08/30/2015  CLINICAL  DATA:  No bowel movement since last Thursday. EXAM: ABDOMEN - 1 VIEW COMPARISON:  None. FINDINGS: Large colonic stool burden without evidence of enteric obstruction. No supine evidence of pneumoperitoneum. No pneumatosis or portal venous gas. Limited visualization of lower thorax is normal. Moderate scoliotic curvature of the lumbar spine, convex to the left with associated moderate to severe multilevel lumbar spine DDD, incompletely evaluated. Vascular calcifications. IMPRESSION: Large colonic stool burden without evidence of enteric obstruction. Electronically Signed   By: Sandi Mariscal M.D.   On: 08/30/2015 19:21   Ct Head Wo Contrast  08/30/2015  CLINICAL DATA:  History of bladder cancer. Current smoker. History TIA. EXAM: CT HEAD WITHOUT CONTRAST TECHNIQUE: Contiguous axial images were obtained from the base of the skull through the vertex without intravenous contrast. COMPARISON:  None. FINDINGS: Scattered periventricular hypodensities compatible microvascular ischemic disease. 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 Given background parenchymal abnormalities, there is no CT evidence of superimposed acute large territory infarct. No intraparenchymal or extra-axial mass or hemorrhage. Normal size and configuration of the ventricles and basilar cisterns. No midline shift. Intracranial atherosclerosis. Limited visualization of the paranasal sinuses and mastoid air cells is normal. No air-fluid levels. Regional soft tissues appear normal. Post bilateral cataract surgery. No displaced calvarial fracture.  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 Electronically Signed   By: Sandi Mariscal M.D.   On: 08/30/2015 19:02   Ct Chest Wo Contrast  08/30/2015  CLINICAL DATA:  Diagnosed with pneumonia April 27th, start antibiotics, elevated white blood count EXAM: CT CHEST WITHOUT CONTRAST TECHNIQUE: Multidetector CT imaging of the chest was performed following the standard protocol without IV contrast. COMPARISON:  08/23/2015 FINDINGS: Moderate diffuse centrilobular emphysema. As expected this process is predominant in the mid to upper lung zones particularly in the apices. Irregular apical opacity bilaterally likely represents pleural thickening. There is mild bilateral bronchitic change. The right lung is clear otherwise. On the left, there is consolidation in the medial lingula with air bronchograms. This area measures about 5.1 x 3.9 cm. The consolidative process extends into the left hilum. There is some narrowing of left upper lobe bronchus. Mild consolidation in the inferior tip of the lingula is likely atelectasis. There is mild dependent atelectasis in the left lower lobe. There is sub carinal adenopathy measuring up to 15 mm. An AP window lymph node measures 10 mm and another AP window lymph node measures 13 mm. There is a pretracheal lymph node that measures 11 mm. There is a small left pleural effusion. There is calcification of the aorta and of the coronary arteries. There is no pericardial effusion. Images through the upper abdomen demonstrate innumerable low-attenuation liver masses measuring up to about 2.3 cm. There are no acute musculoskeletal findings. IMPRESSION: Consolidation in the medial lingula may indicate pneumonia. However, the possibility of postobstructive pneumonitis is suspected as the contours are concerning for the possibility of left perihilar mass. Mediastinal adenopathy may be metastatic. The presence of numerous low-attenuation liver lesions,  with attenuation values not consistent with cysts, is concerning for the presence of metastatic disease and increases the degree of suspicion for the left perihilar process. Small left pleural effusion. Electronically Signed   By: Skipper Cliche M.D.   On: 08/30/2015 19:09      Assessment & Plan: Pt is a 75 y.o. male with a PMHx of bladder cancer, hyperthyroidism, history of TIA, asthma, hypertension, COPD long-standing tobacco abuse, who was admitted to [facility] on 08/30/2015 for evaluation of  generalized weakness.  Patient on a possible postobstructive pneumonia with mediastinal adenopathyand liver lesions highly suspicious for underlyinglung cancer.  1.  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  2.  Mediastinal adenopathy. 3.  Liver lesions, possible metastatic disease. 4.  Bacterial pneumonia, most likely postobstructive in nature.  Plan:   The patient appears to have a fairly complex case.  He was recently diagnosed with pneumonia as an outpatient which now appears to be potentially postobstructive.  He has long-standing tobacco abuse which places him at high risk for bronchogenic carcinoma.  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  We will obtain serum osmolality, urine osmolality, TSH, cortisol, and uric acid for additional workup.  For now we will plan to start the patient on 3% saline once PICC line has been placed.  We will check serum sodium every 4 hours.  We recommend a goal serum sodium of 124 within the next 24 hours.  We will attempt to correct the sodium fairly slowly.  We also recommend workup by oncology as well as pulmonary.  I would like to thank Dr. Benjie Karvonen for this kind and interesting consultation.

## 2015-08-31 NOTE — Progress Notes (Signed)
MD was made aware of pt's na+ level of  119

## 2015-08-31 NOTE — Care Management (Signed)
Admitted to this facility with the diagnosis of hyponatremia. Lives with wife, Margaretha Sheffield  [phone]). Last seem Dr. Caryn Section either Tuesday or Wednesday of this week. Takes care of all basic and instrumental activities of daily living himself, drives. No home health. No skilled facility. No home oxygen. Uses no aids for ambulation. Decreased appetite x 1 month. Fell last Sunday. Bladder Cancer about a year ago. Received chemotherapy then per sons. Family will transport. Physical therapy evaluation completed. Recommends home with home health/physical therapy. Discussed agencies with the sons. (Mr. Dobratz in the bathroom). [place]. Shelbie Ammons RN MSN CCM Care Management [phone]

## 2015-08-31 NOTE — Progress Notes (Signed)
Initial Nutrition Assessment  DOCUMENTATION CODES:   Not applicable  INTERVENTION:   -Cater to pt preferences on Regular diet. RD collected menu for pt. -Recommend Ensure Enlive po BID, each supplement provides 350 kcal and 20 grams of protein   NUTRITION DIAGNOSIS:   Inadequate oral intake related to acute illness as evidenced by per patient/family report.  GOAL:   Patient will meet greater than or equal to 90% of their needs  MONITOR:   PO intake, Supplement acceptance, Labs, I & O's, Weight trends  REASON FOR ASSESSMENT:   Malnutrition Screening Tool    ASSESSMENT:   Pt is a 75 year old male with COPD and tobacco dependence who recently stopped smoking who presents with weakness and found to have hyponatremia per MD note. Pt also with abnormal CT scan possibly concerning for possible lung cancer with recent treatment for pneumonia.  Past Medical History  Diagnosis Date  . Malignant neoplasm of bladder ([place])   . Hyperthyroidism   . Nodular prostate with urinary retention   . TIA (transient ischemic attack)   . Acid reflux   . Asthma   . Hypertension   . Malignant neoplasm of lateral wall of urinary bladder ([place])   . Temporary cerebral vascular dysfunction 07/29/2014  . Airway hyperreactivity 07/29/2014  . 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 COPD, mild ([place]) 10/11/2014    Diet Order:  Diet regular Room service appropriate?: Yes; Fluid consistency:: Thin    Pt ate 25% of breakfast this morning, bacon and some toast. Pt reports medications making things not taste well this morning.  Pt family reports pt with poor po intake for the past 2 weeks. Per family pt usually eats cereal for breakfast, no lunch and typically eats his dinner, but for the past 2 weeks not wanting to eat very much. Pt reports no supplement PTA, family interested now.     Medications: Lactulose, MVI, Protonix, Prednisone, Senokot, Dulcolax, Sodium Phosphate Labs: Na 116,  115  Gastrointestinal Profile: Last BM:  08/31/2015   Nutrition-Focused Physical Exam Findings: Nutrition-Focused physical exam completed. Findings are WDL for fat depletion, muscle depletion, and edema, however RD notes some mild depletion of thigh region. Pt reports he has been much less mobile recently but usually is active per family.   Weight Change: Pt reports last weight of 156lbs but unsure when that last was taken. Pt weight relatively stable per trend. Pt current weight 154lbs.   Skin:  Reviewed, no issues   Height:   Ht Readings from Last 1 Encounters:  08/30/15 '5\' 9"'$  (1.753 m)    Weight:   Wt Readings from Last 1 Encounters:  08/30/15 154 lb 8.7 oz (70.1 kg)   Wt Readings from Last 10 Encounters:  08/30/15 154 lb 8.7 oz (70.1 kg)  08/27/15 156 lb 9.6 oz (71.033 kg)  08/07/15 156 lb (70.761 kg)  06/07/15 152 lb (68.947 kg)  05/02/15 150 lb (68.04 kg)  01/30/15 148 lb 1.6 oz (67.178 kg)  01/04/15 151 lb (68.493 kg)  12/07/14 151 lb 9.6 oz (68.765 kg)  10/05/14 153 lb 4.8 oz (69.536 kg)  09/28/14 153 lb 11.2 oz (69.718 kg)     BMI:  Body mass index is 22.81 kg/(m^2).  Estimated Nutritional Needs:   Kcal:  1750-2100kcals  Protein:  56-70g protein  Fluid:  >1.8L fluid  EDUCATION NEEDS:   No education needs identified at this time  Dwyane Luo, RD, LDN Pager [phone] Weekend/On-Call Pager [phone]

## 2015-08-31 NOTE — Progress Notes (Signed)
DR MODY AND DR LATEEF were made aware of pt's sodium level on 114, no new order at this time continue to [employer]

## 2015-08-31 NOTE — Progress Notes (Signed)
[facility] NAME: Bruce May    MR#:  [id number]  DATE OF BIRTH:  [birth year]  SUBJECTIVE:  CHIEF COMPLAINT:   Chief Complaint  Patient presents with  . Altered Mental Status   -Patient with past medical history of COPD, hypertension admitted with generalized weakness. Recently started on Levaquin for pneumonia. -Noted to have a sodium of 115 on admission. Baseline sodium seems to be around 143  REVIEW OF SYSTEMS:  Review of Systems  Constitutional: Positive for malaise/fatigue. Negative for fever and chills.  HENT: Positive for hearing loss. Negative for congestion, ear discharge and nosebleeds.   Eyes: Positive for blurred vision. Negative for double vision.  Respiratory: Negative for cough, shortness of breath and wheezing.   Cardiovascular: Negative for chest pain, palpitations and leg swelling.  Gastrointestinal: Negative for nausea, vomiting, abdominal pain, diarrhea and constipation.  Genitourinary: Negative for dysuria and urgency.  Musculoskeletal: Positive for myalgias. Negative for back pain.  Neurological: Negative for dizziness, focal weakness, seizures and headaches.  Psychiatric/Behavioral: Negative for depression.    DRUG ALLERGIES:  No Known Allergies  VITALS:  Blood pressure 166/83, pulse 91, temperature 97.5 F (36.4 C), temperature source Oral, resp. rate 20, height '5\' 9"'$  (1.753 m), weight 70.1 kg (154 lb 8.7 oz), SpO2 95 %.  PHYSICAL EXAMINATION:  Physical Exam  GENERAL:  75 y.o.-year-old patient lying in the bed with no acute distress. Hard of hearing. EYES: Pupils equal, round, reactive to light and accommodation. No scleral icterus. Extraocular muscles intact.  HEENT: Head atraumatic, normocephalic. Oropharynx and nasopharynx clear.  NECK:  Supple, no jugular venous distention. No thyroid enlargement, no tenderness.  LUNGS: Normal breath sounds bilaterally, no wheezing, rales,rhonchi or  crepitation. No use of accessory muscles of respiration. Decreased right basilar breath sounds. CARDIOVASCULAR: S1, S2 normal. No  rubs, or gallops. 3/6 systolic murmur is present. ABDOMEN: Soft, nontender, nondistended. Bowel sounds present. No organomegaly or mass.  EXTREMITIES: No pedal edema, cyanosis, or clubbing.  NEUROLOGIC: Cranial nerves II through XII are intact. Muscle strength 5/5 in all extremities. Sensation intact. Gait not checked.  PSYCHIATRIC: The patient is alert and oriented x 3.  SKIN: No obvious rash, lesion, or ulcer.    LABORATORY PANEL:   CBC  Recent Labs Lab 08/31/15 0500  WBC 13.1*  HGB 10.6*  HCT 30.5*  PLT 426   ------------------------------------------------------------------------------------------------------------------  Chemistries   Recent Labs Lab 08/30/15 1746  08/31/15 0500  08/31/15 1245  NA 115*  < > 117*  < > 119*  K 4.9  --  4.0  --   --   CL 86*  --  89*  --   --   CO2 21*  --  20*  --   --   GLUCOSE 121*  --  100*  --   --   BUN 19  --  19  --   --   CREATININE 0.81  < > 0.79  --   --   CALCIUM 8.4*  --  7.8*  --   --   AST 41  --   --   --   --   ALT 23  --   --   --   --   ALKPHOS 58  --   --   --   --   BILITOT 0.6  --   --   --   --   < > = values in this interval not displayed. ------------------------------------------------------------------------------------------------------------------  Cardiac Enzymes  Recent Labs Lab 08/30/15 1828  TROPONINI 0.06*   ------------------------------------------------------------------------------------------------------------------  RADIOLOGY:  Dg Abd 1 View  08/30/2015  CLINICAL DATA:  No bowel movement since last Thursday. EXAM: ABDOMEN - 1 VIEW COMPARISON:  None. FINDINGS: Large colonic stool burden without evidence of enteric obstruction. No supine evidence of pneumoperitoneum. No pneumatosis or portal venous gas. Limited visualization of lower thorax is normal. Moderate  scoliotic curvature of the lumbar spine, convex to the left with associated moderate to severe multilevel lumbar spine DDD, incompletely evaluated. Vascular calcifications. IMPRESSION: Large colonic stool burden without evidence of enteric obstruction. Electronically Signed   By: Sandi Mariscal M.D.   On: 08/30/2015 19:21   Ct Head Wo Contrast  08/30/2015  CLINICAL DATA:  History of bladder cancer. Current smoker. History TIA. EXAM: CT HEAD WITHOUT CONTRAST TECHNIQUE: Contiguous axial images were obtained from the base of the skull through the vertex without intravenous contrast. COMPARISON:  None. FINDINGS: Scattered periventricular hypodensities compatible microvascular ischemic disease. 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 Given background parenchymal abnormalities, there is no CT evidence of superimposed acute large territory infarct. No intraparenchymal or extra-axial mass or hemorrhage. Normal size and configuration of the ventricles and basilar cisterns. No midline shift. Intracranial atherosclerosis. Limited visualization of the paranasal sinuses and mastoid air cells is normal. No air-fluid levels. Regional soft tissues appear normal. Post bilateral cataract surgery. No displaced calvarial fracture. 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 Electronically Signed   By: Sandi Mariscal M.D.   On: 08/30/2015 19:02   Ct Chest Wo Contrast  08/30/2015  CLINICAL DATA:  Diagnosed with pneumonia April 27th, start antibiotics, elevated white blood count EXAM: CT CHEST WITHOUT CONTRAST TECHNIQUE: Multidetector CT imaging of the chest was performed following the standard protocol without IV contrast. COMPARISON:  08/23/2015 FINDINGS: Moderate diffuse centrilobular emphysema. As expected this process is predominant in the mid to upper lung zones particularly in the apices. Irregular apical opacity bilaterally likely represents pleural thickening. There is mild  bilateral bronchitic change. The right lung is clear otherwise. On the left, there is consolidation in the medial lingula with air bronchograms. This area measures about 5.1 x 3.9 cm. The consolidative process extends into the left hilum. There is some narrowing of left upper lobe bronchus. Mild consolidation in the inferior tip of the lingula is likely atelectasis. There is mild dependent atelectasis in the left lower lobe. There is sub carinal adenopathy measuring up to 15 mm. An AP window lymph node measures 10 mm and another AP window lymph node measures 13 mm. There is a pretracheal lymph node that measures 11 mm. There is a small left pleural effusion. There is calcification of the aorta and of the coronary arteries. There is no pericardial effusion. Images through the upper abdomen demonstrate innumerable low-attenuation liver masses measuring up to about 2.3 cm. There are no acute musculoskeletal findings. IMPRESSION: Consolidation in the medial lingula may indicate pneumonia. However, the possibility of postobstructive pneumonitis is suspected as the contours are concerning for the possibility of left perihilar mass. Mediastinal adenopathy may be metastatic. The presence of numerous low-attenuation liver lesions, with attenuation values not consistent with cysts, is concerning for the presence of metastatic disease and increases the degree of suspicion for the left perihilar process. Small left pleural effusion. Electronically Signed   By: Skipper Cliche M.D.   On: 08/30/2015 19:09    EKG:   Orders placed or performed during the hospital encounter of  08/30/15  . ED EKG  . ED EKG  . EKG 12-Lead  . EKG 12-Lead    ASSESSMENT AND PLAN:   75 year old male with past medical history significant for history of bladder cancer, TIA, GERD, hypertension, COPD and history of smoking presents to the hospital secondary to weakness and noted to have sodium of 115.  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 -Agree with normal saline. Sodium improved up to 119. PICC line will be placed on 3% saline will be started today. -Ones sodium reaches up to 124, plan is to stop the 3% saline and maybe start tolvaptan - Appreciate nephrology consult..  #2 COPD with community-acquired pneumonia-possible postobstructive pneumonitis suspected based on CT scan. -Continue low-dose prednisone at this time. Also on Levaquin. -Continue inhalers and as needed nebs.  #3 left lung mass noted on CT chest-with prior history of smoking. Also liver lesions noted. -Pulmonary and also oncology consults requested at this time.  #4 BPH-continue home medications.  #5 DVT prophylaxis-on Lovenox   Physical therapy consulted Family updated at bedside.    All the records are reviewed and case discussed with Care Management/Social Workerr. Management plans discussed with the patient, family and they are in agreement.  CODE STATUS: Full code  TOTAL TIME TAKING CARE OF THIS PATIENT: 37 minutes.   POSSIBLE D/C IN 2-3 DAYS, DEPENDING ON CLINICAL CONDITION.   Gladstone Lighter M.D on 08/31/2015 at 3:23 PM  Between 7am to 6pm - Pager - [phone]  After 6pm go to [email or web address] - password [email or web address] [facility]  Office  [phone]  CC: Primary care physician; Lelon Huh, MD

## 2015-08-31 NOTE — Progress Notes (Signed)
Critical sodium level of 116 called from [place] in Lab, this is an improvement from the previous value of 114.  MD is aware of critical low sodium level. Pt continues to receive hypertonic 3% saline at 53m/hr. Next sodium level to be drawn q4 hours at 0100 09/01/2015.

## 2015-09-01 LAB — SODIUM
SODIUM: 120 mmol/L — AB (ref 135–145)
Sodium: 121 mmol/L — ABNORMAL LOW (ref 135–145)
Sodium: 124 mmol/L — ABNORMAL LOW (ref 135–145)

## 2015-09-01 LAB — CBC
HCT: 29.7 % — ABNORMAL LOW (ref 40.0–52.0)
HEMOGLOBIN: 10.4 g/dL — AB (ref 13.0–18.0)
MCH: 30.6 pg (ref 26.0–34.0)
MCHC: 35.1 g/dL (ref 32.0–36.0)
MCV: 87.2 fL (ref 80.0–100.0)
Platelets: 339 10*3/uL (ref 150–440)
RBC: 3.4 MIL/uL — AB (ref 4.40–5.90)
RDW: 13.6 % (ref 11.5–14.5)
WBC: 10.6 10*3/uL (ref 3.8–10.6)

## 2015-09-01 LAB — BASIC METABOLIC PANEL
ANION GAP: 5 (ref 5–15)
BUN: 15 mg/dL (ref 6–20)
CALCIUM: 7.8 mg/dL — AB (ref 8.9–10.3)
CHLORIDE: 93 mmol/L — AB (ref 101–111)
CO2: 21 mmol/L — AB (ref 22–32)
Creatinine, Ser: 0.71 mg/dL (ref 0.61–1.24)
GFR calc non Af Amer: >60 mL/min (ref >60)
Glucose, Bld: 104 mg/dL — ABNORMAL HIGH (ref 65–99)
POTASSIUM: 4.2 mmol/L (ref 3.5–5.1)
Sodium: 119 mmol/L — CL (ref 135–145)

## 2015-09-01 LAB — THYROID PANEL WITH TSH
FREE THYROXINE INDEX: 1.9 (ref 1.2–4.9)
T3 UPTAKE RATIO: 36 % (ref 24–39)
T4, Total: 5.2 ug/dL (ref 4.5–12.0)
TSH: 2.34 u[IU]/mL (ref 0.450–4.500)

## 2015-09-01 NOTE — Progress Notes (Signed)
[facility]  ROUNDING NOTE   Subjective:  Serum sodium up to 120 this a.m. Appears to be tolerating 3% saline quite well. Patient reports that he's feeling much better today. Urine osmolality significantly higher than serum osmolality. Uric acid also low at 2.7 also suggesting SIADH.  Objective:  Vital signs in last 24 hours:  Temp:  [97.9 F (36.6 C)-98.6 F (37 C)] 97.9 F (36.6 C) (05/06 0512) Pulse Rate:  [76-91] 77 (05/06 0512) Resp:  [17-18] 17 (05/06 0512) BP: (132-166)/(70-75) 163/71 mmHg (05/06 0512) SpO2:  [95 %-97 %] 95 % (05/06 0512)  Weight change:  Filed Weights   08/30/15 1740 08/30/15 2200  Weight: 68.04 kg (150 lb) 70.1 kg (154 lb 8.7 oz)    Intake/Output: I/O last 3 completed shifts: In: 789.8 [I.V.:789.8] Out: -    Intake/Output this shift:     Physical Exam: General: NAD, sitting up in bed  Head: Normocephalic, atraumatic. Moist oral mucosal membranes  Eyes: Anicteric  Neck: Supple, trachea midline  Lungs:  Bilateral rhonchi, normal effort  Heart: Regular rate and rhythm no rubs  Abdomen:  Soft, nontender, BS present  Extremities: no peripheral edema.  Neurologic: Nonfocal, moving all four extremities  Skin: No lesions       Basic Metabolic Panel:  Recent Labs Lab 08/27/15 1429  08/30/15 1746 08/30/15 2213 08/31/15 0500  08/31/15 1245 08/31/15 1704 08/31/15 2111 09/01/15 0224 09/01/15 0509  NA 117*  < > 115* 117* 117*  < > 119* 114* 116* 119* 120*  K 5.1  --  4.9  --  4.0  --   --   --   --  4.2  --   CL 79*  --  86*  --  89*  --   --   --   --  93*  --   CO2 23  --  21*  --  20*  --   --   --   --  21*  --   GLUCOSE 98  --  121*  --  100*  --   --   --   --  104*  --   BUN 18  --  19  --  19  --   --   --   --  15  --   CREATININE 1.03  --  0.81 0.83 0.79  --   --   --   --  0.71  --   CALCIUM 8.9  --  8.4*  --  7.8*  --   --   --   --  7.8*  --   < > = values in this interval not displayed.  Liver Function  Tests:  Recent Labs Lab 08/27/15 1429 08/30/15 1746  AST 40 41  ALT 21 23  ALKPHOS 76 58  BILITOT 0.5 0.6  PROT 6.2 6.3*  ALBUMIN 3.4* 3.0*    Recent Labs Lab 08/30/15 1828  LIPASE 23   No results for input(s): AMMONIA in the last 168 hours.  CBC:  Recent Labs Lab 08/27/15 1429 08/30/15 1746 08/30/15 2213 08/31/15 0500 09/01/15 0224  WBC 15.4* 14.2* 13.2* 13.1* 10.6  NEUTROABS 11.3*  --   --   --   --   HGB  --  10.6* 10.5* 10.6* 10.4*  HCT 37.5 30.7* 30.1* 30.5* 29.7*  MCV 90 86.8 86.9 87.6 87.2  PLT 518* 438 404 426 339    Cardiac Enzymes:  Recent Labs Lab 08/30/15 1828  TROPONINI  0.06*    BNP: Invalid input(s): POCBNP  CBG:  Recent Labs Lab 08/30/15 2204  GLUCAP 130*    Microbiology: Results for orders placed or performed during the hospital encounter of 08/30/15  Culture, blood (Routine X 2) w Reflex to ID Panel     Status: None (Preliminary result)   Collection Time: 08/30/15  6:27 PM  Result Value Ref Range Status   Specimen Description BLOOD LEFT ANTECUBITAL  Final   Special Requests BOTTLES DRAWN AEROBIC AND ANAEROBIC  5CC  Final   Culture NO GROWTH < 24 HOURS  Final   Report Status PENDING  Incomplete  Culture, blood (Routine X 2) w Reflex to ID Panel     Status: None (Preliminary result)   Collection Time: 08/30/15  6:28 PM  Result Value Ref Range Status   Specimen Description BLOOD RIGHT HAND  Final   Special Requests BOTTLES DRAWN AEROBIC AND ANAEROBIC  [place]  Final   Culture NO GROWTH < 24 HOURS  Final   Report Status PENDING  Incomplete  MRSA PCR Screening     Status: None   Collection Time: 08/30/15 10:01 PM  Result Value Ref Range Status   MRSA by PCR NEGATIVE NEGATIVE Final    Comment:        The GeneXpert MRSA Assay (FDA approved for NASAL specimens only), is one component of a comprehensive MRSA colonization surveillance program. It is not intended to diagnose MRSA infection nor to guide or monitor treatment  for MRSA infections.     Coagulation Studies: No results for input(s): LABPROT, INR in the last 72 hours.  Urinalysis:  Recent Labs  08/31/15 1044  COLORURINE YELLOW*  LABSPEC 1.018  PHURINE 6.0  GLUCOSEU NEGATIVE  HGBUR NEGATIVE  BILIRUBINUR NEGATIVE  KETONESUR NEGATIVE  PROTEINUR >500*  NITRITE NEGATIVE  LEUKOCYTESUR NEGATIVE      Imaging: Dg Abd 1 View  08/30/2015  CLINICAL DATA:  No bowel movement since last Thursday. EXAM: ABDOMEN - 1 VIEW COMPARISON:  None. FINDINGS: Large colonic stool burden without evidence of enteric obstruction. No supine evidence of pneumoperitoneum. No pneumatosis or portal venous gas. Limited visualization of lower thorax is normal. Moderate scoliotic curvature of the lumbar spine, convex to the left with associated moderate to severe multilevel lumbar spine DDD, incompletely evaluated. Vascular calcifications. IMPRESSION: Large colonic stool burden without evidence of enteric obstruction. Electronically Signed   By: Sandi Mariscal M.D.   On: 08/30/2015 19:21   Ct Head Wo Contrast  08/30/2015  CLINICAL DATA:  History of bladder cancer. Current smoker. History TIA. EXAM: CT HEAD WITHOUT CONTRAST TECHNIQUE: Contiguous axial images were obtained from the base of the skull through the vertex without intravenous contrast. COMPARISON:  None. FINDINGS: Scattered periventricular hypodensities compatible microvascular ischemic disease. 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 Given background parenchymal abnormalities, there is no CT evidence of superimposed acute large territory infarct. No intraparenchymal or extra-axial mass or hemorrhage. Normal size and configuration of the ventricles and basilar cisterns. No midline shift. Intracranial atherosclerosis. Limited visualization of the paranasal sinuses and mastoid air cells is normal. No air-fluid levels. Regional soft tissues appear normal. Post bilateral cataract surgery. No displaced calvarial fracture.  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 Electronically Signed   By: Sandi Mariscal M.D.   On: 08/30/2015 19:02   Ct Chest Wo Contrast  08/30/2015  CLINICAL DATA:  Diagnosed with pneumonia April 27th, start antibiotics, elevated white blood count EXAM: CT CHEST WITHOUT CONTRAST TECHNIQUE:  Multidetector CT imaging of the chest was performed following the standard protocol without IV contrast. COMPARISON:  08/23/2015 FINDINGS: Moderate diffuse centrilobular emphysema. As expected this process is predominant in the mid to upper lung zones particularly in the apices. Irregular apical opacity bilaterally likely represents pleural thickening. There is mild bilateral bronchitic change. The right lung is clear otherwise. On the left, there is consolidation in the medial lingula with air bronchograms. This area measures about 5.1 x 3.9 cm. The consolidative process extends into the left hilum. There is some narrowing of left upper lobe bronchus. Mild consolidation in the inferior tip of the lingula is likely atelectasis. There is mild dependent atelectasis in the left lower lobe. There is sub carinal adenopathy measuring up to 15 mm. An AP window lymph node measures 10 mm and another AP window lymph node measures 13 mm. There is a pretracheal lymph node that measures 11 mm. There is a small left pleural effusion. There is calcification of the aorta and of the coronary arteries. There is no pericardial effusion. Images through the upper abdomen demonstrate innumerable low-attenuation liver masses measuring up to about 2.3 cm. There are no acute musculoskeletal findings. IMPRESSION: Consolidation in the medial lingula may indicate pneumonia. However, the possibility of postobstructive pneumonitis is suspected as the contours are concerning for the possibility of left perihilar mass. Mediastinal adenopathy may be metastatic. The presence of numerous low-attenuation liver lesions,  with attenuation values not consistent with cysts, is concerning for the presence of metastatic disease and increases the degree of suspicion for the left perihilar process. Small left pleural effusion. Electronically Signed   By: Skipper Cliche M.D.   On: 08/30/2015 19:09   Dg Chest Port 1 View  08/31/2015  CLINICAL DATA:  PICC line placement EXAM: PORTABLE CHEST 1 VIEW COMPARISON:  08/30/2015 FINDINGS: Cardiomediastinal silhouette is stable. Again noted left perihilar infiltrate or infiltrative process. There is right arm PICC line with tip coiled mid SVC going cephalad most likely within azygos vein. Retraction about 3 cm and reposition is recommended. There is no pneumothorax. IMPRESSION: Again noted left perihilar infiltrate or infiltrative process. There is right arm PICC line with tip coiled mid SVC going cephalad most likely within azygos vein. Retraction about 3 cm and reposition is recommended. There is no pneumothorax. These results were called by telephone at the time of interpretation on 08/31/2015 at 3:43 pm to Dr. Gladstone Lighter , who verbally acknowledged these results. Electronically Signed   By: Lahoma Crocker M.D.   On: 08/31/2015 15:43   Dg Chest Port 1 View  08/31/2015  CLINICAL DATA:  Interval re- adjustment right upper extremity PICC line. EXAM: PORTABLE CHEST 1 VIEW COMPARISON:  Chest radiograph earlier same day ; chest radiograph 08/23/2015 FINDINGS: Right upper extremity PICC line is present with tip projecting in the expected location of the superior cavoatrial junction. Multiple monitoring leads overlie the patient. Stable cardiac and mediastinal contours. Re- demonstrated heterogeneous opacities within the left mid and lower lung. No pleural effusion or pneumothorax. Regional skeleton is unremarkable. IMPRESSION: Re- demonstrated heterogeneous opacities left mid and lower lung concerning for pneumonia in the appropriate clinical setting. Followup PA and lateral chest X-ray is  recommended in 3-4 weeks following trial of antibiotic therapy to ensure resolution and exclude underlying malignancy. Right upper extremity PICC line tip terminates in the superior cavoatrial junction. Electronically Signed   By: Lovey Newcomer M.D.   On: 08/31/2015 15:41     Medications:   . sodium chloride (  hypertonic) 20 mL/hr (09/01/15 0659)   . aspirin  325 mg Oral Daily  . bisacodyl  10 mg Rectal Daily  . enoxaparin (LOVENOX) injection  40 mg Subcutaneous Q24H  . feeding supplement (ENSURE ENLIVE)  237 mL Oral BID BM  . fenofibrate  160 mg Oral Daily  . finasteride  5 mg Oral Daily  . fluticasone furoate-vilanterol  2 puff Inhalation Daily  . lactulose  30 g Oral BID  . levofloxacin  750 mg Oral Daily  . loratadine  10 mg Oral Daily  . multivitamin with minerals  1 tablet Oral Daily  . pantoprazole  40 mg Oral Daily  . polyethylene glycol  17 g Oral Daily  . pravastatin  80 mg Oral Daily  . predniSONE  10 mg Oral Daily  . senna  1 tablet Oral Daily  . sodium phosphate  1 enema Rectal Once  . tiotropium  18 mcg Inhalation BID   acetaminophen **OR** acetaminophen, HYDROcodone-acetaminophen, ondansetron **OR** ondansetron (ZOFRAN) IV  Assessment/ Plan:  75 y.o. May  with a PMHx of bladder cancer, hyperthyroidism, history of TIA, asthma, hypertension, COPD long-standing tobacco abuse, who was admitted to [facility] on 08/30/2015 for evaluation of generalized weakness. Patient on a possible postobstructive pneumonia with mediastinal adenopathyand liver lesions highly suspicious for underlyinglung cancer.  1. Hyponatremia, due to SIADH. High urine osm, low serum osm, low uric acid all suggestive of SIADH. 2. Mediastinal adenopathy. 3. Liver lesions, possible metastatic disease. 4. Bacterial pneumonia, most likely postobstructive in nature.   Plan:  As above laboratory studies suggest SIADH as the patient has a high urine osmolality, low urine osmolality and low uric acid. History  also appears to be consistent with this. He has responded to 3% saline administered via PICC line. We will continue 3% saline but decrease the rate slightly to 20 cc per hour. Continue every 4 hours sodium checks. He will need additional workup through pulmonology as well as oncology. We will continue to follow progress closely with you.  LOS: 2 Dafina Suk 5/6/20178:26 AM

## 2015-09-01 NOTE — Progress Notes (Signed)
Patient appears to be resting in the bed, family at bedside, no c/o pain or discomfort.

## 2015-09-01 NOTE — Progress Notes (Signed)
[facility] [place] NAME: Bruce May    MR#:  [id number]  DATE OF BIRTH:  [birth year]  SUBJECTIVE:   Mental status much improved and sodium 121 this morning. Family at bedside. Feels much better.  REVIEW OF SYSTEMS:  Review of Systems  Constitutional: Positive for malaise/fatigue. Negative for fever and chills.  HENT: Positive for hearing loss. Negative for congestion, ear discharge and nosebleeds.   Eyes: Negative for blurred vision and double vision.  Respiratory: Negative for cough, shortness of breath and wheezing.   Cardiovascular: Negative for chest pain, palpitations and leg swelling.  Gastrointestinal: Negative for nausea, vomiting, abdominal pain, diarrhea and constipation.  Genitourinary: Negative for dysuria and urgency.  Musculoskeletal: Positive for myalgias. Negative for back pain.  Neurological: Negative for dizziness, focal weakness, seizures and headaches.  Psychiatric/Behavioral: Negative for depression.    DRUG ALLERGIES:  No Known Allergies  VITALS:  Blood pressure 119/70, pulse 84, temperature 98 F (36.7 C), temperature source Oral, resp. rate 18, height '5\' 9"'$  (1.753 m), weight 70.1 kg (154 lb 8.7 oz), SpO2 95 %.  PHYSICAL EXAMINATION:  Physical Exam  GENERAL:  75 y.o.-year-old patient lying in the bed in no acute distress. Hard of hearing. EYES: Pupils equal, round, reactive to light and accommodation. No scleral icterus. Extraocular muscles intact.  HEENT: Head atraumatic, normocephalic. Oropharynx and nasopharynx clear.  NECK:  Supple, no jugular venous distention. No thyroid enlargement, no tenderness.  LUNGS: Normal breath sounds bilaterally, no wheezing, rales,rhonchi or crepitation. No use of accessory muscles of respiration. Decreased right basilar breath sounds. CARDIOVASCULAR: S1, S2 normal. No  rubs, or gallops. 3/6 systolic murmur is present. ABDOMEN: Soft, nontender, nondistended. Bowel  sounds present. No organomegaly or mass.  EXTREMITIES: No pedal edema, cyanosis, or clubbing.  NEUROLOGIC: Cranial nerves II through XII are intact. No focal motor or sensory deficits patient bilaterally. Globally weak.  PSYCHIATRIC: The patient is alert and oriented x 3.  SKIN: No obvious rash, lesion, or ulcer.    LABORATORY PANEL:   CBC  Recent Labs Lab 09/01/15 0224  WBC 10.6  HGB 10.4*  HCT 29.7*  PLT 339   ------------------------------------------------------------------------------------------------------------------  Chemistries   Recent Labs Lab 08/30/15 1746  09/01/15 0224  09/01/15 1037  NA 115*  < > 119*  < > 121*  K 4.9  < > 4.2  --   --   CL 86*  < > 93*  --   --   CO2 21*  < > 21*  --   --   GLUCOSE 121*  < > 104*  --   --   BUN 19  < > 15  --   --   CREATININE 0.81  < > 0.71  --   --   CALCIUM 8.4*  < > 7.8*  --   --   AST 41  --   --   --   --   ALT 23  --   --   --   --   ALKPHOS 58  --   --   --   --   BILITOT 0.6  --   --   --   --   < > = values in this interval not displayed. ------------------------------------------------------------------------------------------------------------------  Cardiac Enzymes  Recent Labs Lab 08/30/15 1828  TROPONINI 0.06*   ------------------------------------------------------------------------------------------------------------------  RADIOLOGY:  Dg Abd 1 View  08/30/2015  CLINICAL DATA:  No bowel movement since last Thursday. EXAM: ABDOMEN -  1 VIEW COMPARISON:  None. FINDINGS: Large colonic stool burden without evidence of enteric obstruction. No supine evidence of pneumoperitoneum. No pneumatosis or portal venous gas. Limited visualization of lower thorax is normal. Moderate scoliotic curvature of the lumbar spine, convex to the left with associated moderate to severe multilevel lumbar spine DDD, incompletely evaluated. Vascular calcifications. IMPRESSION: Large colonic stool burden without evidence of  enteric obstruction. Electronically Signed   By: Sandi Mariscal M.D.   On: 08/30/2015 19:21   Ct Head Wo Contrast  08/30/2015  CLINICAL DATA:  History of bladder cancer. Current smoker. History TIA. EXAM: CT HEAD WITHOUT CONTRAST TECHNIQUE: Contiguous axial images were obtained from the base of the skull through the vertex without intravenous contrast. COMPARISON:  None. FINDINGS: Scattered periventricular hypodensities compatible microvascular ischemic disease. 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 Given background parenchymal abnormalities, there is no CT evidence of superimposed acute large territory infarct. No intraparenchymal or extra-axial mass or hemorrhage. Normal size and configuration of the ventricles and basilar cisterns. No midline shift. Intracranial atherosclerosis. Limited visualization of the paranasal sinuses and mastoid air cells is normal. No air-fluid levels. Regional soft tissues appear normal. Post bilateral cataract surgery. No displaced calvarial fracture. 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 Electronically Signed   By: Sandi Mariscal M.D.   On: 08/30/2015 19:02   Ct Chest Wo Contrast  08/30/2015  CLINICAL DATA:  Diagnosed with pneumonia April 27th, start antibiotics, elevated white blood count EXAM: CT CHEST WITHOUT CONTRAST TECHNIQUE: Multidetector CT imaging of the chest was performed following the standard protocol without IV contrast. COMPARISON:  08/23/2015 FINDINGS: Moderate diffuse centrilobular emphysema. As expected this process is predominant in the mid to upper lung zones particularly in the apices. Irregular apical opacity bilaterally likely represents pleural thickening. There is mild bilateral bronchitic change. The right lung is clear otherwise. On the left, there is consolidation in the medial lingula with air bronchograms. This area measures about 5.1 x 3.9 cm. The consolidative process extends into the left  hilum. There is some narrowing of left upper lobe bronchus. Mild consolidation in the inferior tip of the lingula is likely atelectasis. There is mild dependent atelectasis in the left lower lobe. There is sub carinal adenopathy measuring up to 15 mm. An AP window lymph node measures 10 mm and another AP window lymph node measures 13 mm. There is a pretracheal lymph node that measures 11 mm. There is a small left pleural effusion. There is calcification of the aorta and of the coronary arteries. There is no pericardial effusion. Images through the upper abdomen demonstrate innumerable low-attenuation liver masses measuring up to about 2.3 cm. There are no acute musculoskeletal findings. IMPRESSION: Consolidation in the medial lingula may indicate pneumonia. However, the possibility of postobstructive pneumonitis is suspected as the contours are concerning for the possibility of left perihilar mass. Mediastinal adenopathy may be metastatic. The presence of numerous low-attenuation liver lesions, with attenuation values not consistent with cysts, is concerning for the presence of metastatic disease and increases the degree of suspicion for the left perihilar process. Small left pleural effusion. Electronically Signed   By: Skipper Cliche M.D.   On: 08/30/2015 19:09   Dg Chest Port 1 View  08/31/2015  CLINICAL DATA:  PICC line placement EXAM: PORTABLE CHEST 1 VIEW COMPARISON:  08/30/2015 FINDINGS: Cardiomediastinal silhouette is stable. Again noted left perihilar infiltrate or infiltrative process. There is right arm PICC line with tip coiled mid SVC going cephalad most  likely within azygos vein. Retraction about 3 cm and reposition is recommended. There is no pneumothorax. IMPRESSION: Again noted left perihilar infiltrate or infiltrative process. There is right arm PICC line with tip coiled mid SVC going cephalad most likely within azygos vein. Retraction about 3 cm and reposition is recommended. There is no  pneumothorax. These results were called by telephone at the time of interpretation on 08/31/2015 at 3:43 pm to Dr. Gladstone Lighter , who verbally acknowledged these results. Electronically Signed   By: Lahoma Crocker M.D.   On: 08/31/2015 15:43   Dg Chest Port 1 View  08/31/2015  CLINICAL DATA:  Interval re- adjustment right upper extremity PICC line. EXAM: PORTABLE CHEST 1 VIEW COMPARISON:  Chest radiograph earlier same day ; chest radiograph 08/23/2015 FINDINGS: Right upper extremity PICC line is present with tip projecting in the expected location of the superior cavoatrial junction. Multiple monitoring leads overlie the patient. Stable cardiac and mediastinal contours. Re- demonstrated heterogeneous opacities within the left mid and lower lung. No pleural effusion or pneumothorax. Regional skeleton is unremarkable. IMPRESSION: Re- demonstrated heterogeneous opacities left mid and lower lung concerning for pneumonia in the appropriate clinical setting. Followup PA and lateral chest X-ray is recommended in 3-4 weeks following trial of antibiotic therapy to ensure resolution and exclude underlying malignancy. Right upper extremity PICC line tip terminates in the superior cavoatrial junction. Electronically Signed   By: Lovey Newcomer M.D.   On: 08/31/2015 15:41    ASSESSMENT AND PLAN:   75 year old male with past medical history significant for history of bladder cancer, TIA, GERD, hypertension, COPD and history of smoking presents to the hospital secondary to weakness and noted to have sodium of 115.  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 -Appreciate nephrology input. Patient started on 3% saline and sodium is improving. -Continuous to follow sodium closely. Mental status much improved. Plan to stop 3% saline 1 sodium is up to 124. As per nephrology maybe start tolvaptan  #2 COPD with community-acquired pneumonia-possible postobstructive  pneumonitis suspected based on CT scan. - cont. Prednisone, Levaquin.  - cont. Breo-ellipta, Spiriva.   #3 left lung mass noted on CT chest-with prior history of smoking. Also liver lesions noted. -Appreciate pulmonary input and patient may need bronchoscopy/endobronchial ultrasound. -Also seen by oncology in agreement with pulmonary evaluation and plan for possible PET scan as an outpatient. Continue supportive care for now.  #4 BPH-continue finasteride  #5 hyperlipidemia-continue Pravachol, fenofibrate.  #6 GERD-continue Protonix.  Pt eval noted and will arrange for Home health services upon discharge.   All the records are reviewed and case discussed with Care Management/Social Workerr. Management plans discussed with the patient, family and they are in agreement.  CODE STATUS: Full code  DVT Prophylaxis - Lovenox.   TOTAL TIME TAKING CARE OF THIS PATIENT: 30 minutes.   POSSIBLE D/C IN 2-3 DAYS, DEPENDING ON CLINICAL CONDITION.   Henreitta Leber M.D on 09/01/2015 at 3:12 PM  Between 7am to 6pm - Pager - [phone]  After 6pm go to [email or web address] - password [email or web address] [facility]  Office  [phone]  CC: Primary care physician; Lelon Huh, MD

## 2015-09-01 NOTE — Progress Notes (Signed)
Critical sodium level continues to improve, now at 119, MD aware.  Hypertonic 3% solution continues to infuse at 6m/hr, continue to monitor q4hours sodium level.

## 2015-09-01 NOTE — Progress Notes (Signed)
IVF reduced to 15m/hr per new order acknowledged this am.

## 2015-09-01 NOTE — Consult Note (Signed)
Pulmonary Critical Care  Initial Consult Note   Bruce May KZS:[id number] DOB: 1940/07/05 DOA: 08/30/2015  Referring physician: Bettey Costa, MD PCP: Lelon Huh, MD   Chief Complaint: shortness of breath  HPI: Bruce May is a 75 y.o. male with prior historyu of COPD ongoing nicotine use. TIA HTN presented to the hospital for increased weakness. Patient is seen in the office and had been found to have a LLL pneumonia. He was started on Levaquin and also prednisone. Patient on follow up in the office had seen an improvement though not quite back to baseline so his antibiotics were extended with the plan to do follow up radiological studies to demonstrate clearing of the infiltrates. This was on May1st. Patient came into the ED here on May 4th with increased weakness. A CT chest was done which shows presence of lingular consolidation and presence of air bronchograms. There was some concern for presence of narrowing of the left upper lobe bronchus along with some adenopathy. In addition there are some low attenuation lesions in the liver noted concerning for mets according to the radiology report.    Review of Systems:  Constitutional:  No weight loss, night sweats, Fevers, chills, fatigue.  HEENT:  No headaches, nasal congestion, post nasal drip,  Cardio-vascular:  No chest pain, Orthopnea, PND, swelling in lower extremities, anasarca, dizziness, palpitations  GI:  No heartburn, indigestion, abdominal pain, nausea, vomiting, diarrhea  Resp:  +shortness of breath. +productive cough, No coughing up of blood Skin:  no rash or lesions.  Musculoskeletal:  No joint pain or swelling.   Remainder ROS performed and is unremarkable other than noted in HPI  Past Medical History  Diagnosis Date  . Malignant neoplasm of bladder ([place])   . Hyperthyroidism   . Nodular prostate with urinary retention   . TIA (transient ischemic attack)   . Acid reflux   . Asthma   . Hypertension   .  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 Temporary cerebral vascular dysfunction 07/29/2014  . Airway hyperreactivity 07/29/2014  . 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 COPD, mild ([place]) 10/11/2014   Past Surgical History  Procedure Laterality Date  . Bladder surgery  2012  . Back surgery     Social History:  reports that he has been smoking Cigarettes.  He has been smoking about 1.00 pack per day. He does not have any smokeless tobacco history on file. He reports that he does not drink alcohol or use illicit drugs.  No Known Allergies  Family History  Problem Relation Age of Onset  . Kidney disease Sister   . Diabetes Sister   . Bladder Cancer Neg Hx   . Prostate cancer Neg Hx     Prior to Admission medications   Medication Sig Start Date End Date Taking? Authorizing Provider  albuterol (PROVENTIL HFA;VENTOLIN HFA) 108 (90 BASE) MCG/ACT inhaler Inhale 2 puffs into the lungs 4 (four) times daily as needed.    Yes Historical Provider, MD  aspirin 325 MG EC tablet Take 325 mg by mouth daily.   Yes Historical Provider, MD  cetirizine (ZYRTEC) 10 MG tablet Take 10 mg by mouth daily.   Yes Historical Provider, MD  CHANTIX STARTING MONTH PAK 0.5 MG X 11 & 1 MG X 42 tablet  08/21/15  Yes Historical Provider, MD  fenofibrate 160 MG tablet Take 1 tablet (160 mg total) by mouth daily. 06/12/15  Yes Margo Common, PA  fenoprofen (NALFON) 600 MG TABS tablet Take 600 mg by mouth daily.   Yes Historical Provider, MD  finasteride (PROSCAR) 5 MG tablet Take 1 tablet (5 mg total) by mouth daily. 12/07/14  Yes Collier Flowers, MD  fluticasone furoate-vilanterol (BREO ELLIPTA) 200-25 MCG/INH AEPB Inhale 2 puffs into the lungs daily.   Yes Historical Provider, MD  HYDROcodone-acetaminophen (NORCO/VICODIN) 5-325 MG tablet Take 1 tablet by mouth every 6 (six) hours as needed.  08/15/15 09/14/15 Yes Historical Provider, MD  levofloxacin (LEVAQUIN) 750 MG tablet Take  750 mg by mouth daily.  08/21/15  Yes Historical Provider, MD  Multiple Vitamin (MULTIVITAMIN WITH MINERALS) TABS tablet Take 1 tablet by mouth daily.   Yes Historical Provider, MD  nystatin (MYCOSTATIN) 100000 UNIT/ML suspension Take 5 mLs by mouth 4 (four) times daily.   Yes Historical Provider, MD  Omega-3 Fatty Acids (FISH OIL EXTRA STRENGTH) 1200 MG CAPS Take by mouth.   Yes Historical Provider, MD  omeprazole (PRILOSEC) 40 MG capsule TAKE ONE CAPSULE BY MOUTH ONCE DAILY AS NEEDED 08/16/15  Yes Dennis E Chrismon, PA  pravastatin (PRAVACHOL) 80 MG tablet TAKE ONE TABLET BY MOUTH ONCE DAILY 03/15/15  Yes Birdie Sons, MD  predniSONE (STERAPRED UNI-PAK 21 TAB) 10 MG (21) TBPK tablet Take 10 mg by mouth daily.   Yes Historical Provider, MD  tiotropium (SPIRIVA) 18 MCG inhalation capsule Place 18 mcg into inhaler and inhale 2 (two) times daily.    Yes Historical Provider, MD   Physical Exam: Filed Vitals:   08/31/15 0945 08/31/15 2106 08/31/15 2108 09/01/15 0512  BP:  132/75 166/70 163/71  Pulse: 91 83 76 77  Temp:  98.6 F (37 C)  97.9 F (36.6 C)  TempSrc:  Oral  Oral  Resp:  18  17  Height:      Weight:      SpO2: 95% 97%  95%    Wt Readings from Last 3 Encounters:  08/30/15 70.1 kg (154 lb 8.7 oz)  08/27/15 71.033 kg (156 lb 9.6 oz)  08/07/15 70.761 kg (156 lb)    General:  Appears calm and comfortable Eyes: PERRL, normal lids, irises & conjunctiva ENT: grossly normal hearing, lips & tongue Neck: no LAD, masses or thyromegaly Cardiovascular: RRR, no m/r/g. No LE edema. Respiratory: no w/r/r. Normal respiratory effort. Abdomen: soft, nontender Skin: no rash or induration seen on limited exam Musculoskeletal: grossly normal tone BUE/BLE Psychiatric: grossly normal mood and affect Neurologic: grossly non-focal.          Labs on Admission:  Basic Metabolic Panel:  Recent Labs Lab 08/27/15 1429  08/30/15 1746 08/30/15 2213 08/31/15 0500  08/31/15 1704  08/31/15 2111 09/01/15 0224 09/01/15 0509 09/01/15 1037  NA 117*  < > 115* 117* 117*  < > 114* 116* 119* 120* 121*  K 5.1  --  4.9  --  4.0  --   --   --  4.2  --   --   CL 79*  --  86*  --  89*  --   --   --  93*  --   --   CO2 23  --  21*  --  20*  --   --   --  21*  --   --   GLUCOSE 98  --  121*  --  100*  --   --   --  104*  --   --   BUN 18  --  19  --  19  --   --   --  15  --   --   CREATININE 1.03  --  0.81 0.83 0.79  --   --   --  0.71  --   --   CALCIUM 8.9  --  8.4*  --  7.8*  --   --   --  7.8*  --   --   < > = values in this interval not displayed. Liver Function Tests:  Recent Labs Lab 08/27/15 1429 08/30/15 1746  AST 40 41  ALT 21 23  ALKPHOS 76 58  BILITOT 0.5 0.6  PROT 6.2 6.3*  ALBUMIN 3.4* 3.0*    Recent Labs Lab 08/30/15 1828  LIPASE 23   No results for input(s): AMMONIA in the last 168 hours. CBC:  Recent Labs Lab 08/27/15 1429 08/30/15 1746 08/30/15 2213 08/31/15 0500 09/01/15 0224  WBC 15.4* 14.2* 13.2* 13.1* 10.6  NEUTROABS 11.3*  --   --   --   --   HGB  --  10.6* 10.5* 10.6* 10.4*  HCT 37.5 30.7* 30.1* 30.5* 29.7*  MCV 90 86.8 86.9 87.6 87.2  PLT 518* 438 404 426 339   Cardiac Enzymes:  Recent Labs Lab 08/30/15 1828  TROPONINI 0.06*    BNP (last 3 results) No results for input(s): BNP in the last 8760 hours.  ProBNP (last 3 results) No results for input(s): PROBNP in the last 8760 hours.  CBG:  Recent Labs Lab 08/30/15 2204  GLUCAP 130*    Radiological Exams on Admission: Dg Abd 1 View  08/30/2015  CLINICAL DATA:  No bowel movement since last Thursday. EXAM: ABDOMEN - 1 VIEW COMPARISON:  None. FINDINGS: Large colonic stool burden without evidence of enteric obstruction. No supine evidence of pneumoperitoneum. No pneumatosis or portal venous gas. Limited visualization of lower thorax is normal. Moderate scoliotic curvature of the lumbar spine, convex to the left with associated moderate to severe multilevel lumbar  spine DDD, incompletely evaluated. Vascular calcifications. IMPRESSION: Large colonic stool burden without evidence of enteric obstruction. Electronically Signed   By: Sandi Mariscal M.D.   On: 08/30/2015 19:21   Ct Head Wo Contrast  08/30/2015  CLINICAL DATA:  History of bladder cancer. Current smoker. History TIA. EXAM: CT HEAD WITHOUT CONTRAST TECHNIQUE: Contiguous axial images were obtained from the base of the skull through the vertex without intravenous contrast. COMPARISON:  None. FINDINGS: Scattered periventricular hypodensities compatible microvascular ischemic disease. 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 Given background parenchymal abnormalities, there is no CT evidence of superimposed acute large territory infarct. No intraparenchymal or extra-axial mass or hemorrhage. Normal size and configuration of the ventricles and basilar cisterns. No midline shift. Intracranial atherosclerosis. Limited visualization of the paranasal sinuses and mastoid air cells is normal. No air-fluid levels. Regional soft tissues appear normal. Post bilateral cataract surgery. No displaced calvarial fracture. 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 Electronically Signed   By: Sandi Mariscal M.D.   On: 08/30/2015 19:02   Ct Chest Wo Contrast  08/30/2015  CLINICAL DATA:  Diagnosed with pneumonia April 27th, start antibiotics, elevated white blood count EXAM: CT CHEST WITHOUT CONTRAST TECHNIQUE: Multidetector CT imaging of the chest was performed following the standard protocol without IV contrast. COMPARISON:  08/23/2015 FINDINGS: Moderate diffuse centrilobular emphysema. As expected this process is predominant in the mid to upper lung zones particularly in the apices. Irregular apical opacity bilaterally likely represents pleural thickening. There is mild bilateral bronchitic  change. The right lung is clear otherwise. On the left, there is consolidation in the medial  lingula with air bronchograms. This area measures about 5.1 x 3.9 cm. The consolidative process extends into the left hilum. There is some narrowing of left upper lobe bronchus. Mild consolidation in the inferior tip of the lingula is likely atelectasis. There is mild dependent atelectasis in the left lower lobe. There is sub carinal adenopathy measuring up to 15 mm. An AP window lymph node measures 10 mm and another AP window lymph node measures 13 mm. There is a pretracheal lymph node that measures 11 mm. There is a small left pleural effusion. There is calcification of the aorta and of the coronary arteries. There is no pericardial effusion. Images through the upper abdomen demonstrate innumerable low-attenuation liver masses measuring up to about 2.3 cm. There are no acute musculoskeletal findings. IMPRESSION: Consolidation in the medial lingula may indicate pneumonia. However, the possibility of postobstructive pneumonitis is suspected as the contours are concerning for the possibility of left perihilar mass. Mediastinal adenopathy may be metastatic. The presence of numerous low-attenuation liver lesions, with attenuation values not consistent with cysts, is concerning for the presence of metastatic disease and increases the degree of suspicion for the left perihilar process. Small left pleural effusion. Electronically Signed   By: Skipper Cliche M.D.   On: 08/30/2015 19:09   Dg Chest Port 1 View  08/31/2015  CLINICAL DATA:  PICC line placement EXAM: PORTABLE CHEST 1 VIEW COMPARISON:  08/30/2015 FINDINGS: Cardiomediastinal silhouette is stable. Again noted left perihilar infiltrate or infiltrative process. There is right arm PICC line with tip coiled mid SVC going cephalad most likely within azygos vein. Retraction about 3 cm and reposition is recommended. There is no pneumothorax. IMPRESSION: Again noted left perihilar infiltrate or infiltrative process. There is right arm PICC line with tip coiled mid SVC  going cephalad most likely within azygos vein. Retraction about 3 cm and reposition is recommended. There is no pneumothorax. These results were called by telephone at the time of interpretation on 08/31/2015 at 3:43 pm to Dr. Gladstone Lighter , who verbally acknowledged these results. Electronically Signed   By: Lahoma Crocker M.D.   On: 08/31/2015 15:43   Dg Chest Port 1 View  08/31/2015  CLINICAL DATA:  Interval re- adjustment right upper extremity PICC line. EXAM: PORTABLE CHEST 1 VIEW COMPARISON:  Chest radiograph earlier same day ; chest radiograph 08/23/2015 FINDINGS: Right upper extremity PICC line is present with tip projecting in the expected location of the superior cavoatrial junction. Multiple monitoring leads overlie the patient. Stable cardiac and mediastinal contours. Re- demonstrated heterogeneous opacities within the left mid and lower lung. No pleural effusion or pneumothorax. Regional skeleton is unremarkable. IMPRESSION: Re- demonstrated heterogeneous opacities left mid and lower lung concerning for pneumonia in the appropriate clinical setting. Followup PA and lateral chest X-ray is recommended in 3-4 weeks following trial of antibiotic therapy to ensure resolution and exclude underlying malignancy. Right upper extremity PICC line tip terminates in the superior cavoatrial junction. Electronically Signed   By: Lovey Newcomer M.D.   On: 08/31/2015 15:41      EKG: Independently reviewed.  Assessment/Plan Active Problems:   Hyponatremia   1. Persistent Left Pneumonia Patient was initially started on antibiotics on 4/27 and followed up on 5/1 in the office now CT shows possible mets in liver and possible narrowing of upper lobe on left side In the setting of the CT findings it is reasonable  to perform a bronchoscopy for airway evaluation Possibility of doing an EBUS if the regular bronchoscopy is unremarkable. If there is no significant endobronchial lesion noted then would consider  evaluating the liver lesions Continue with levaquin for now  2. COPD Continue with steroids Continue with inhalers Continue with antibiotics  3. Hyponatremia Again of concern and may be related to underlying pulmonary process Agree with present management per nephrology   Code Status: Full Code DVT Prophylaxis:Lovenox will need to stop ASA also prior to procedure    Time spent: 90mn    I have personally obtained a history, examined the patient, evaluated laboratory and imaging results, formulated the assessment and plan and placed orders.  The Patient requires high complexity decision making for assessment and support.    SAllyne Gee MD FKindred Hospital ParamountPulmonary Critical Care Medicine Sleep Medicine

## 2015-09-01 NOTE — Progress Notes (Signed)
Bedside report received, patient alert and oriented, denies any pain, family at bedside, mood calm, no acute distress noted, iv fluid infusing at 20 ml /hr as per order, will continue to monitor

## 2015-09-01 NOTE — Consult Note (Signed)
[facility]  Date of admission:  08/30/2015  Inpatient day:  09/01/2015  Consulting physician: Gladstone Lighter   Reason for Consultation:  Left lung mass  Chief Complaint: Bruce May is a 75 y.o. male with COPD who was admitted with hyponatremia (Na 115).  HPI: The patient has a greater than 60-pack-year smoking history. He notes that over the past 1-2 months he has become weak and fatigued. Appetite has decreased. He has lost about 10 pounds.  He states that he was initially treated for bronchitis about 2 weeks ago.  He was then diagnosed with a left lower lobe pneumonia.  He has been on Levaquin and prednisone.  He notes that the antibody course was extended as he was not doing well.  Chest CT on 08/30/2015 revealed consolidation in the medial lingula consistent with pneumonia or possibly postobstructive pneumonitis as the contours were concerning for a left perihilar mass.  There was mediastinal adenopathy (1.1-1.5 cm). There were numerous low attenuation liver lesions concerning for metastatic disease.  There was a small left-sided pleural effusion.  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  He is on hypertonic saline.  Sodium has improved to 121.  He is on Levaquin for his pneumonia.  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  Cystoscopy approximately a month ago was negative.   Past Medical History  Diagnosis Date  . Malignant neoplasm of bladder ([place])   . Hyperthyroidism   . Nodular prostate with urinary retention   . TIA (transient ischemic attack)   . Acid reflux   . Asthma   . Hypertension   . 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 Temporary cerebral vascular dysfunction 07/29/2014  . Airway hyperreactivity 07/29/2014  . 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  COPD, mild ([place]) 10/11/2014    Past Surgical History  Procedure Laterality Date  . Bladder surgery  2012  . Back surgery      Family History  Problem Relation Age of Onset  . Kidney disease Sister   . Diabetes Sister   . Bladder Cancer Neg Hx   . Prostate cancer Neg Hx     Social History:  reports that he has been smoking Cigarettes.  He has been smoking about 1.00 pack per day. He does not have any smokeless tobacco history on file. He reports that he does not drink alcohol or use illicit drugs. The patient has smoked 1 pack a day for 66 years.  He started smoking at age 83.  He lives with his wife in [place].  He is retired.  He ran heavy equipment.  The patient is accompanied by his son, Lennette Bihari.  Allergies: No Known Allergies  Medications Prior to Admission  Medication Sig Dispense Refill  . albuterol (PROVENTIL HFA;VENTOLIN HFA) 108 (90 BASE) MCG/ACT inhaler Inhale 2 puffs into the lungs 4 (four) times daily as needed.     Marland Kitchen aspirin 325 MG EC tablet Take 325 mg by mouth daily.    . cetirizine (ZYRTEC) 10 MG tablet Take 10 mg by mouth daily.    . CHANTIX STARTING MONTH PAK 0.5 MG X 11 & 1 MG X 42 tablet     . fenofibrate 160 MG tablet Take 1 tablet (160 mg total) by mouth daily. 90 tablet 3  . fenoprofen (NALFON) 600 MG TABS tablet  Take 600 mg by mouth daily.    . finasteride (PROSCAR) 5 MG tablet Take 1 tablet (5 mg total) by mouth daily. 90 tablet 3  . fluticasone furoate-vilanterol (BREO ELLIPTA) 200-25 MCG/INH AEPB Inhale 2 puffs into the lungs daily.    Marland Kitchen HYDROcodone-acetaminophen (NORCO/VICODIN) 5-325 MG tablet Take 1 tablet by mouth every 6 (six) hours as needed.     Marland Kitchen levofloxacin (LEVAQUIN) 750 MG tablet Take 750 mg by mouth daily.     . Multiple Vitamin (MULTIVITAMIN WITH MINERALS) TABS tablet Take 1 tablet by mouth daily.    Marland Kitchen nystatin (MYCOSTATIN) 100000 UNIT/ML suspension Take 5 mLs by mouth 4 (four) times daily.    . Omega-3 Fatty Acids (FISH OIL EXTRA  STRENGTH) 1200 MG CAPS Take by mouth.    Marland Kitchen omeprazole (PRILOSEC) 40 MG capsule TAKE ONE CAPSULE BY MOUTH ONCE DAILY AS NEEDED 90 capsule 0  . pravastatin (PRAVACHOL) 80 MG tablet TAKE ONE TABLET BY MOUTH ONCE DAILY 90 tablet 3  . predniSONE (STERAPRED UNI-PAK 21 TAB) 10 MG (21) TBPK tablet Take 10 mg by mouth daily.    Marland Kitchen tiotropium (SPIRIVA) 18 MCG inhalation capsule Place 18 mcg into inhaler and inhale 2 (two) times daily.       Review of Systems: GENERAL:  Feels weak.  No fevers or sweats.  Weight loss of 10 pounds in the past 1-2 months. PERFORMANCE STATUS (ECOG):  2 HEENT:  Decreased hearing.  No visual changes, runny nose, sore throat, mouth sores or tenderness. Lungs: Shortness of breath.  Cough.  Pneumonia.  No hemoptysis. Cardiac:  No chest pain, palpitations, orthopnea, or PND. GI:  Poor appetite x 2 months.  No nausea, vomiting, diarrhea, constipation, melena or hematochezia. GU:  History of bladder cancer.  Cystoscopy last month was negative.  No urgency, frequency, dysuria, or hematuria. Musculoskeletal:  Chronic back pain for years.  No joint pain.  No muscle tenderness. Extremities:  No pain or swelling. Skin:  No rashes or skin changes. Neuro:  General weakness with trouble walking and standing.  No headache, numbness or focal weakness, balance or coordination issues. Endocrine:  No diabetes, thyroid issues, hot flashes or night sweats. Psych:  No mood changes, depression or anxiety. Pain:  No focal pain. Review of systems:  All other systems reviewed and found to be negative.  Physical Exam:  Blood pressure 163/71, pulse 77, temperature 97.9 F (36.6 C), temperature source Oral, resp. rate 17, height _0  (1.753 m), weight 154 lb 8.7 oz (70.1 kg), SpO2 95 %.  GENERAL:  Elderly gentleman sitting comfortably on the medical unit in no acute distress. MENTAL STATUS:  Alert and oriented to person, place and time. HEAD:  Thin short gray hair.  Normocephalic, atraumatic, face  symmetric, no Cushingoid features. EYES:  Glasses.  Hazel eyes.  Pupils equal round and reactive to light and accomodation.  No conjunctivitis or scleral icterus. ENT:  Oropharynx clear without lesion.  Dentures.  Tongue normal. Mucous membranes moist.  RESPIRATORY:  Clear to auscultation without rales, wheezes or rhonchi.  Intermittent cough.   CARDIOVASCULAR:  Regular rate and rhythm without murmur, rub or gallop. ABDOMEN:  Soft, non-tender, with active bowel sounds, and no hepatosplenomegaly.  No masses. SKIN:  No rashes, ulcers or lesions. EXTREMITIES: No edema, no skin discoloration or tenderness.  No palpable cords. LYMPH NODES: No palpable cervical, supraclavicular, axillary or inguinal adenopathy  NEUROLOGICAL: Unremarkable. PSYCH:  Appropriate.  Results for orders placed or performed during the hospital encounter of  08/30/15 (from the past 48 hour(s))  Comprehensive metabolic panel     Status: Abnormal   Collection Time: 08/30/15  5:46 PM  Result Value Ref Range   Sodium 115 (LL) 135 - 145 mmol/L    Comment: CRITICAL RESULT CALLED TO, READ BACK BY AND VERIFIED WITH MARY NEEDHAM AT 1825 08/30/15 MLZ    Potassium 4.9 3.5 - 5.1 mmol/L   Chloride 86 (L) 101 - 111 mmol/L   CO2 21 (L) 22 - 32 mmol/L   Glucose, Bld 121 (H) 65 - 99 mg/dL   BUN 19 6 - 20 mg/dL   Creatinine, Ser 0.81 0.61 - 1.24 mg/dL   Calcium 8.4 (L) 8.9 - 10.3 mg/dL   Total Protein 6.3 (L) 6.5 - 8.1 g/dL   Albumin 3.0 (L) 3.5 - 5.0 g/dL   AST 41 15 - 41 U/L   ALT 23 17 - 63 U/L   Alkaline Phosphatase 58 38 - 126 U/L   Total Bilirubin 0.6 0.3 - 1.2 mg/dL   GFR calc non Af Amer >60 >60 mL/min   GFR calc Af Amer >60 >60 mL/min    Comment: (NOTE) The eGFR has been calculated using the CKD EPI equation. This calculation has not been validated in all clinical situations. eGFR's persistently <60 mL/min signify possible Chronic Kidney Disease.    Anion gap 8 5 - 15  CBC     Status: Abnormal   Collection Time:  08/30/15  5:46 PM  Result Value Ref Range   WBC 14.2 (H) 3.8 - 10.6 K/uL   RBC 3.54 (L) 4.40 - 5.90 MIL/uL   Hemoglobin 10.6 (L) 13.0 - 18.0 g/dL   HCT 30.7 (L) 40.0 - 52.0 %   MCV 86.8 80.0 - 100.0 fL   MCH 30.1 26.0 - 34.0 pg   MCHC 34.6 32.0 - 36.0 g/dL   RDW 13.6 11.5 - 14.5 %   Platelets 438 150 - 440 K/uL  Culture, blood (Routine X 2) w Reflex to ID Panel     Status: None (Preliminary result)   Collection Time: 08/30/15  6:27 PM  Result Value Ref Range   Specimen Description BLOOD LEFT ANTECUBITAL    Special Requests BOTTLES DRAWN AEROBIC AND ANAEROBIC  5CC    Culture NO GROWTH < 24 HOURS    Report Status PENDING   Culture, blood (Routine X 2) w Reflex to ID Panel     Status: None (Preliminary result)   Collection Time: 08/30/15  6:28 PM  Result Value Ref Range   Specimen Description BLOOD RIGHT HAND    Special Requests BOTTLES DRAWN AEROBIC AND ANAEROBIC  [place]    Culture NO GROWTH < 24 HOURS    Report Status PENDING   Lactic acid, plasma     Status: None   Collection Time: 08/30/15  6:28 PM  Result Value Ref Range   Lactic Acid, Venous 0.9 0.5 - 2.0 mmol/L  TSH     Status: None   Collection Time: 08/30/15  6:28 PM  Result Value Ref Range   TSH 2.836 0.350 - 4.500 uIU/mL  Uric acid     Status: Abnormal   Collection Time: 08/30/15  6:28 PM  Result Value Ref Range   Uric Acid, Serum 3.2 (L) 4.4 - 7.6 mg/dL  Osmolality     Status: Abnormal   Collection Time: 08/30/15  6:28 PM  Result Value Ref Range   Osmolality 244 (LL) 275 - 295 mOsm/kg    Comment: CRITICAL RESULT CALLED  TO, READ BACK BY AND VERIFIED WITH: DR Jacqualine Code AT 1953 08/30/15 MLZ   Cortisol     Status: None   Collection Time: 08/30/15  6:28 PM  Result Value Ref Range   Cortisol, Plasma 6.7 ug/dL    Comment: (NOTE) AM    6.7 - 22.6 ug/dL PM   <10.0       ug/dL Performed at [facility]   Troponin I     Status: Abnormal   Collection Time: 08/30/15  6:28 PM  Result Value Ref Range   Troponin I 0.06  (H) <0.031 ng/mL    Comment: READ BACK AND VERIFIED WITH MEGAN  JONES AT 2016 08/30/15 SDR        PERSISTENTLY INCREASED TROPONIN VALUES IN THE RANGE OF 0.04-0.49 ng/mL CAN BE SEEN IN:       -UNSTABLE ANGINA       -CONGESTIVE HEART FAILURE       -MYOCARDITIS       -CHEST TRAUMA       -ARRYHTHMIAS       -LATE PRESENTING MYOCARDIAL INFARCTION       -COPD   CLINICAL FOLLOW-UP RECOMMENDED.   Lipase, blood     Status: None   Collection Time: 08/30/15  6:28 PM  Result Value Ref Range   Lipase 23 11 - 51 U/L  Lactate dehydrogenase     Status: Abnormal   Collection Time: 08/30/15  6:28 PM  Result Value Ref Range   LDH 234 (H) 98 - 192 U/L  MRSA PCR Screening     Status: None   Collection Time: 08/30/15 10:01 PM  Result Value Ref Range   MRSA by PCR NEGATIVE NEGATIVE    Comment:        The GeneXpert MRSA Assay (FDA approved for NASAL specimens only), is one component of a comprehensive MRSA colonization surveillance program. It is not intended to diagnose MRSA infection nor to guide or monitor treatment for MRSA infections.   Glucose, capillary     Status: Abnormal   Collection Time: 08/30/15 10:04 PM  Result Value Ref Range   Glucose-Capillary 130 (H) 65 - 99 mg/dL  Lactic acid, plasma     Status: None   Collection Time: 08/30/15 10:13 PM  Result Value Ref Range   Lactic Acid, Venous 0.9 0.5 - 2.0 mmol/L  CBC     Status: Abnormal   Collection Time: 08/30/15 10:13 PM  Result Value Ref Range   WBC 13.2 (H) 3.8 - 10.6 K/uL   RBC 3.47 (L) 4.40 - 5.90 MIL/uL   Hemoglobin 10.5 (L) 13.0 - 18.0 g/dL   HCT 30.1 (L) 40.0 - 52.0 %   MCV 86.9 80.0 - 100.0 fL   MCH 30.2 26.0 - 34.0 pg   MCHC 34.7 32.0 - 36.0 g/dL   RDW 13.8 11.5 - 14.5 %   Platelets 404 150 - 440 K/uL  Creatinine, serum     Status: None   Collection Time: 08/30/15 10:13 PM  Result Value Ref Range   Creatinine, Ser 0.83 0.61 - 1.24 mg/dL   GFR calc non Af Amer >60 >60 mL/min   GFR calc Af Amer >60 >60 mL/min     Comment: (NOTE) The eGFR has been calculated using the CKD EPI equation. This calculation has not been validated in all clinical situations. eGFR's persistently <60 mL/min signify possible Chronic Kidney Disease.   Uric acid     Status: Abnormal   Collection Time: 08/30/15 10:13 PM  Result Value Ref Range   Uric Acid, Serum 3.3 (L) 4.4 - 7.6 mg/dL  Thyroid Panel With TSH     Status: None   Collection Time: 08/30/15 10:13 PM  Result Value Ref Range   TSH 2.340 0.450 - 4.500 uIU/mL   T4, Total 5.2 4.5 - 12.0 ug/dL   T3 Uptake Ratio 36 24 - 39 %   Free Thyroxine Index 1.9 1.2 - 4.9    Comment: (NOTE) Performed At: [facility] [place], [place] [id number] Lindon Romp MD OY:[id number]   Sodium     Status: Abnormal   Collection Time: 08/30/15 10:13 PM  Result Value Ref Range   Sodium 117 (LL) 135 - 145 mmol/L    Comment: CRITICAL RESULT CALLED TO, READ BACK BY AND VERIFIED WITH VANESSA VAZQUEZ ON 08/30/15 AT 2309 BY TLB   Basic metabolic panel     Status: Abnormal   Collection Time: 08/31/15  5:00 AM  Result Value Ref Range   Sodium 117 (LL) 135 - 145 mmol/L    Comment: CRITICAL RESULT CALLED TO, READ BACK BY AND VERIFIED WITH SARA KLENNER ON 08/31/15 AT 0555 BY TLB    Potassium 4.0 3.5 - 5.1 mmol/L   Chloride 89 (L) 101 - 111 mmol/L   CO2 20 (L) 22 - 32 mmol/L   Glucose, Bld 100 (H) 65 - 99 mg/dL   BUN 19 6 - 20 mg/dL   Creatinine, Ser 0.79 0.61 - 1.24 mg/dL   Calcium 7.8 (L) 8.9 - 10.3 mg/dL   GFR calc non Af Amer >60 >60 mL/min   GFR calc Af Amer >60 >60 mL/min    Comment: (NOTE) The eGFR has been calculated using the CKD EPI equation. This calculation has not been validated in all clinical situations. eGFR's persistently <60 mL/min signify possible Chronic Kidney Disease.    Anion gap 8 5 - 15  CBC     Status: Abnormal   Collection Time: 08/31/15  5:00 AM  Result Value Ref Range   WBC 13.1 (H) 3.8 - 10.6 K/uL   RBC 3.49 (L) 4.40 - 5.90  MIL/uL   Hemoglobin 10.6 (L) 13.0 - 18.0 g/dL   HCT 30.5 (L) 40.0 - 52.0 %   MCV 87.6 80.0 - 100.0 fL   MCH 30.5 26.0 - 34.0 pg   MCHC 34.8 32.0 - 36.0 g/dL   RDW 13.7 11.5 - 14.5 %   Platelets 426 150 - 440 K/uL  Sodium     Status: Abnormal   Collection Time: 08/31/15  9:57 AM  Result Value Ref Range   Sodium 116 (LL) 135 - 145 mmol/L    Comment: CRITICAL RESULT CALLED TO, READ BACK BY AND VERIFIED WITH ABIGAIL JACKSON AT 1101 08/31/15 DAS   Urinalysis complete, with microscopic ([facility] only)     Status: Abnormal   Collection Time: 08/31/15 10:44 AM  Result Value Ref Range   Color, Urine YELLOW (A) YELLOW   APPearance CLEAR (A) CLEAR   Glucose, UA NEGATIVE NEGATIVE mg/dL   Bilirubin Urine NEGATIVE NEGATIVE   Ketones, ur NEGATIVE NEGATIVE mg/dL   Specific Gravity, Urine 1.018 1.005 - 1.030   Hgb urine dipstick NEGATIVE NEGATIVE   pH 6.0 5.0 - 8.0   Protein, ur >500 (A) NEGATIVE mg/dL   Nitrite NEGATIVE NEGATIVE   Leukocytes, UA NEGATIVE NEGATIVE   RBC / HPF 0-5 0 - 5 RBC/hpf   WBC, UA 0-5 0 - 5 WBC/hpf   Bacteria, UA NONE  SEEN NONE SEEN   Squamous Epithelial / LPF NONE SEEN NONE SEEN   Mucous PRESENT    Hyaline Casts, UA PRESENT   Sodium, urine, random     Status: None   Collection Time: 08/31/15 10:44 AM  Result Value Ref Range   Sodium, Ur 136 mmol/L  Osmolality, urine     Status: None   Collection Time: 08/31/15 10:44 AM  Result Value Ref Range   Osmolality, Ur 691 300 - 900 mOsm/kg  Sodium     Status: Abnormal   Collection Time: 08/31/15 12:45 PM  Result Value Ref Range   Sodium 119 (LL) 135 - 145 mmol/L    Comment: CRITICAL RESULT CALLED TO, READ BACK BY AND VERIFIED WITH ABIGAIL JACKSON AT 1335 08/31/15 DAS   Cortisol     Status: None   Collection Time: 08/31/15 12:45 PM  Result Value Ref Range   Cortisol, Plasma 16.2 ug/dL    Comment: (NOTE) AM    6.7 - 22.6 ug/dL PM   <10.0       ug/dL Performed at [facility]   Sodium     Status: Abnormal    Collection Time: 08/31/15  5:04 PM  Result Value Ref Range   Sodium 114 (LL) 135 - 145 mmol/L    Comment: CRITICAL RESULT CALLED TO, READ BACK BY AND VERIFIED WITH ABIGAIL JACKSON AT 1822 08/31/15.PMH  TSH     Status: None   Collection Time: 08/31/15  5:04 PM  Result Value Ref Range   TSH 2.471 0.350 - 4.500 uIU/mL  Osmolality     Status: Abnormal   Collection Time: 08/31/15  5:04 PM  Result Value Ref Range   Osmolality 246 (LL) 275 - 295 mOsm/kg    Comment: CRITICAL RESULT CALLED TO, READ BACK BY AND VERIFIED WITH: ABIGAIL JACKSON AT 1822 08/31/15.PMH   Uric acid     Status: Abnormal   Collection Time: 08/31/15  5:04 PM  Result Value Ref Range   Uric Acid, Serum 2.7 (L) 4.4 - 7.6 mg/dL  Sodium     Status: Abnormal   Collection Time: 08/31/15  9:11 PM  Result Value Ref Range   Sodium 116 (LL) 135 - 145 mmol/L    Comment: CRITICAL RESULT CALLED TO, READ BACK BY AND VERIFIED WITH MELISSA COBB AT 2148 08/31/15.PMH  Basic metabolic panel     Status: Abnormal   Collection Time: 09/01/15  2:24 AM  Result Value Ref Range   Sodium 119 (LL) 135 - 145 mmol/L    Comment: CRITICAL RESULT CALLED TO, READ BACK BY AND VERIFIED WITH MELISSA COBB AT 5361 09/01/15.PMH   Potassium 4.2 3.5 - 5.1 mmol/L   Chloride 93 (L) 101 - 111 mmol/L   CO2 21 (L) 22 - 32 mmol/L   Glucose, Bld 104 (H) 65 - 99 mg/dL   BUN 15 6 - 20 mg/dL   Creatinine, Ser 0.71 0.61 - 1.24 mg/dL   Calcium 7.8 (L) 8.9 - 10.3 mg/dL   GFR calc non Af Amer >60 >60 mL/min   GFR calc Af Amer >60 >60 mL/min    Comment: (NOTE) The eGFR has been calculated using the CKD EPI equation. This calculation has not been validated in all clinical situations. eGFR's persistently <60 mL/min signify possible Chronic Kidney Disease.    Anion gap 5 5 - 15  CBC     Status: Abnormal   Collection Time: 09/01/15  2:24 AM  Result Value Ref Range   WBC 10.6  3.8 - 10.6 K/uL   RBC 3.40 (L) 4.40 - 5.90 MIL/uL   Hemoglobin 10.4 (L) 13.0 - 18.0 g/dL   HCT  29.7 (L) 40.0 - 52.0 %   MCV 87.2 80.0 - 100.0 fL   MCH 30.6 26.0 - 34.0 pg   MCHC 35.1 32.0 - 36.0 g/dL   RDW 13.6 11.5 - 14.5 %   Platelets 339 150 - 440 K/uL  Sodium     Status: Abnormal   Collection Time: 09/01/15  5:09 AM  Result Value Ref Range   Sodium 120 (L) 135 - 145 mmol/L  Sodium     Status: Abnormal   Collection Time: 09/01/15 10:37 AM  Result Value Ref Range   Sodium 121 (L) 135 - 145 mmol/L   Dg Abd 1 View  08/30/2015  CLINICAL DATA:  No bowel movement since last Thursday. EXAM: ABDOMEN - 1 VIEW COMPARISON:  None. FINDINGS: Large colonic stool burden without evidence of enteric obstruction. No supine evidence of pneumoperitoneum. No pneumatosis or portal venous gas. Limited visualization of lower thorax is normal. Moderate scoliotic curvature of the lumbar spine, convex to the left with associated moderate to severe multilevel lumbar spine DDD, incompletely evaluated. Vascular calcifications. IMPRESSION: Large colonic stool burden without evidence of enteric obstruction. Electronically Signed   By: Sandi Mariscal M.D.   On: 08/30/2015 19:21   Ct Head Wo Contrast  08/30/2015  CLINICAL DATA:  History of bladder cancer. Current smoker. History TIA. EXAM: CT HEAD WITHOUT CONTRAST TECHNIQUE: Contiguous axial images were obtained from the base of the skull through the vertex without intravenous contrast. COMPARISON:  None. FINDINGS: Scattered periventricular hypodensities compatible microvascular ischemic disease. 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 Given background parenchymal abnormalities, there is no CT evidence of superimposed acute large territory infarct. No intraparenchymal or extra-axial mass or hemorrhage. Normal size and configuration of the ventricles and basilar cisterns. No midline shift. Intracranial atherosclerosis. Limited visualization of the paranasal sinuses and mastoid air cells is normal. No air-fluid levels. Regional soft tissues appear normal. Post  bilateral cataract surgery. No displaced calvarial fracture. 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 Electronically Signed   By: Sandi Mariscal M.D.   On: 08/30/2015 19:02   Ct Chest Wo Contrast  08/30/2015  CLINICAL DATA:  Diagnosed with pneumonia April 27th, start antibiotics, elevated white blood count EXAM: CT CHEST WITHOUT CONTRAST TECHNIQUE: Multidetector CT imaging of the chest was performed following the standard protocol without IV contrast. COMPARISON:  08/23/2015 FINDINGS: Moderate diffuse centrilobular emphysema. As expected this process is predominant in the mid to upper lung zones particularly in the apices. Irregular apical opacity bilaterally likely represents pleural thickening. There is mild bilateral bronchitic change. The right lung is clear otherwise. On the left, there is consolidation in the medial lingula with air bronchograms. This area measures about 5.1 x 3.9 cm. The consolidative process extends into the left hilum. There is some narrowing of left upper lobe bronchus. Mild consolidation in the inferior tip of the lingula is likely atelectasis. There is mild dependent atelectasis in the left lower lobe. There is sub carinal adenopathy measuring up to 15 mm. An AP window lymph node measures 10 mm and another AP window lymph node measures 13 mm. There is a pretracheal lymph node that measures 11 mm. There is a small left pleural effusion. There is calcification of the aorta and of the coronary arteries. There is no pericardial effusion. Images through the upper abdomen demonstrate innumerable  low-attenuation liver masses measuring up to about 2.3 cm. There are no acute musculoskeletal findings. IMPRESSION: Consolidation in the medial lingula may indicate pneumonia. However, the possibility of postobstructive pneumonitis is suspected as the contours are concerning for the possibility of left perihilar mass. Mediastinal adenopathy may be  metastatic. The presence of numerous low-attenuation liver lesions, with attenuation values not consistent with cysts, is concerning for the presence of metastatic disease and increases the degree of suspicion for the left perihilar process. Small left pleural effusion. Electronically Signed   By: Skipper Cliche M.D.   On: 08/30/2015 19:09   Dg Chest Port 1 View  08/31/2015  CLINICAL DATA:  PICC line placement EXAM: PORTABLE CHEST 1 VIEW COMPARISON:  08/30/2015 FINDINGS: Cardiomediastinal silhouette is stable. Again noted left perihilar infiltrate or infiltrative process. There is right arm PICC line with tip coiled mid SVC going cephalad most likely within azygos vein. Retraction about 3 cm and reposition is recommended. There is no pneumothorax. IMPRESSION: Again noted left perihilar infiltrate or infiltrative process. There is right arm PICC line with tip coiled mid SVC going cephalad most likely within azygos vein. Retraction about 3 cm and reposition is recommended. There is no pneumothorax. These results were called by telephone at the time of interpretation on 08/31/2015 at 3:43 pm to Dr. Gladstone Lighter , who verbally acknowledged these results. Electronically Signed   By: Lahoma Crocker M.D.   On: 08/31/2015 15:43   Dg Chest Port 1 View  08/31/2015  CLINICAL DATA:  Interval re- adjustment right upper extremity PICC line. EXAM: PORTABLE CHEST 1 VIEW COMPARISON:  Chest radiograph earlier same day ; chest radiograph 08/23/2015 FINDINGS: Right upper extremity PICC line is present with tip projecting in the expected location of the superior cavoatrial junction. Multiple monitoring leads overlie the patient. Stable cardiac and mediastinal contours. Re- demonstrated heterogeneous opacities within the left mid and lower lung. No pleural effusion or pneumothorax. Regional skeleton is unremarkable. IMPRESSION: Re- demonstrated heterogeneous opacities left mid and lower lung concerning for pneumonia in the  appropriate clinical setting. Followup PA and lateral chest X-ray is recommended in 3-4 weeks following trial of antibiotic therapy to ensure resolution and exclude underlying malignancy. Right upper extremity PICC line tip terminates in the superior cavoatrial junction. Electronically Signed   By: Lovey Newcomer M.D.   On: 08/31/2015 15:41    Assessment:  The patient is a 75 y.o. gentleman with COPD and a 1-2 month history of progressive weakness and persistent pneumonia.   He has hyponatremia secondary to SIADH.    Chest CT on 08/30/2015 revealed consolidation in the medial lingula consistent with pneumonia or possibly postobstructive pneumonitis as the contours were concerning for a left perihilar mass.  There was mediastinal adenopathy (1.1-1.5 cm). There were numerous low attenuation liver lesions concerning for metastatic disease.  There was a small left-sided pleural effusion.  Head CT without contrast on 08/30/2015 revealed advanced microvascular ischemic disease without acute intracranial process  Findings are worrisome for metastatic lung cancer (small cell).  Plan:   1.  Oncology:  Per pulmonary, bronchoscopy planned.  Anticipate liver biopsy if diagnosis not obtained.  Discussed PET scan with patient.  2.  Hematology:  Normocytic anemia likely due to poor nutrition.  Labs ordered.  3.  Fluids/electrolyes/nutrition:  SIADH.  Nephrology managing hyponatremia.  4.  Infectious disease:  Patient on Levaquin for pneumonia.   Thank you for allowing me to participate in Imlay 's care.  I will follow him  closely with you while hospitalized and after discharge in the outpatient department.   Lequita Asal, MD  09/01/2015, 1:15 PM

## 2015-09-01 NOTE — Plan of Care (Signed)
Problem: Bowel/Gastric: Goal: Will not experience complications related to bowel motility Outcome: Progressing Took over pt care at 1600, pt up OOB to BR, family at bedside

## 2015-09-02 LAB — RETICULOCYTES
RBC.: 3.44 MIL/uL — ABNORMAL LOW (ref 4.40–5.90)
Retic Count, Absolute: 68.8 10*3/uL (ref 19.0–183.0)
Retic Ct Pct: 2 % (ref 0.4–3.1)

## 2015-09-02 LAB — SODIUM
SODIUM: 125 mmol/L — AB (ref 135–145)
SODIUM: 125 mmol/L — AB (ref 135–145)
SODIUM: 127 mmol/L — AB (ref 135–145)
Sodium: 126 mmol/L — ABNORMAL LOW (ref 135–145)
Sodium: 127 mmol/L — ABNORMAL LOW (ref 135–145)
Sodium: 128 mmol/L — ABNORMAL LOW (ref 135–145)

## 2015-09-02 LAB — FERRITIN: Ferritin: 533 ng/mL — ABNORMAL HIGH (ref 24–336)

## 2015-09-02 LAB — IRON AND TIBC
Iron: 47 ug/dL (ref 45–182)
Saturation Ratios: 17 % — ABNORMAL LOW (ref 17.9–39.5)
TIBC: 282 ug/dL (ref 250–450)
UIBC: 235 ug/dL

## 2015-09-02 LAB — FOLATE: Folate: 14.5 ng/mL (ref >5.9)

## 2015-09-02 LAB — VITAMIN B12: Vitamin B-12: 512 pg/mL (ref 180–914)

## 2015-09-02 LAB — SEDIMENTATION RATE: Sed Rate: 35 mm/hr — ABNORMAL HIGH (ref 0–20)

## 2015-09-02 MED ORDER — FUROSEMIDE 40 MG PO TABS
40.0000 mg | ORAL_TABLET | Freq: Every day | ORAL | Status: DC
  Administered 2015-09-02 – 2015-09-03 (×2): 40 mg via ORAL
  Filled 2015-09-02 (×2): qty 1

## 2015-09-02 MED ORDER — SODIUM CHLORIDE 1 G PO TABS
1.0000 g | ORAL_TABLET | Freq: Two times a day (BID) | ORAL | Status: DC
  Administered 2015-09-02 – 2015-09-03 (×2): 1 g via ORAL
  Filled 2015-09-02 (×2): qty 1

## 2015-09-02 MED ORDER — LEVOTHYROXINE SODIUM 100 MCG PO TABS
100.0000 ug | ORAL_TABLET | Freq: Every day | ORAL | Status: DC
  Administered 2015-09-03: 10:00:00 100 ug via ORAL
  Filled 2015-09-02: qty 1

## 2015-09-02 MED ORDER — IPRATROPIUM-ALBUTEROL 0.5-2.5 (3) MG/3ML IN SOLN
3.0000 mL | Freq: Four times a day (QID) | RESPIRATORY_TRACT | Status: DC | PRN
  Administered 2015-09-02: 15:00:00 3 mL via RESPIRATORY_TRACT
  Filled 2015-09-02: qty 3

## 2015-09-02 NOTE — Progress Notes (Signed)
[facility]  ROUNDING NOTE   Subjective:  Serum sodium up to 126. Discussed with hospitalist and we have discontinued 3% saline this a.m. Awaiting definitive diagnosis either through bronchoscopy or liver mass biopsy.  Objective:  Vital signs in last 24 hours:  Temp:  [97.9 F (36.6 C)-98.4 F (36.9 C)] 97.9 F (36.6 C) (05/07 0609) Pulse Rate:  [78-84] 78 (05/07 0609) Resp:  [16-18] 16 (05/07 0609) BP: (119-176)/(57-73) 176/73 mmHg (05/07 0609) SpO2:  [95 %-96 %] 95 % (05/07 0609)  Weight change:  Filed Weights   08/30/15 1740 08/30/15 2200  Weight: 68.04 kg (150 lb) 70.1 kg (154 lb 8.7 oz)    Intake/Output: I/O last 3 completed shifts: In: 462 [P.O.:240; I.V.:588] Out: -    Intake/Output this shift:  Total I/O In: 240 [P.O.:240] Out: -   Physical Exam: General: NAD, sitting up in bed  Head: Normocephalic, atraumatic. Moist oral mucosal membranes  Eyes: Anicteric  Neck: Supple, trachea midline  Lungs:  Bilateral rhonchi, normal effort  Heart: Regular rate and rhythm no rubs  Abdomen:  Soft, nontender, BS present  Extremities: no peripheral edema.  Neurologic: Nonfocal, moving all four extremities  Skin: No lesions       Basic Metabolic Panel:  Recent Labs Lab 08/27/15 1429  08/30/15 1746 08/30/15 2213 08/31/15 0500  09/01/15 0224 09/01/15 0509 09/01/15 1037 09/01/15 2138 09/02/15 0116 09/02/15 0919  NA 117*  < > 115* 117* 117*  < > 119* 120* 121* 124* 125*  125* 126*  K 5.1  --  4.9  --  4.0  --  4.2  --   --   --   --   --   CL 79*  --  86*  --  89*  --  93*  --   --   --   --   --   CO2 23  --  21*  --  20*  --  21*  --   --   --   --   --   GLUCOSE 98  --  121*  --  100*  --  104*  --   --   --   --   --   BUN 18  --  19  --  19  --  15  --   --   --   --   --   CREATININE 1.03  --  0.81 0.83 0.79  --  0.71  --   --   --   --   --   CALCIUM 8.9  --  8.4*  --  7.8*  --  7.8*  --   --   --   --   --   < > = values in this  interval not displayed.  Liver Function Tests:  Recent Labs Lab 08/27/15 1429 08/30/15 1746  AST 40 41  ALT 21 23  ALKPHOS 76 58  BILITOT 0.5 0.6  PROT 6.2 6.3*  ALBUMIN 3.4* 3.0*    Recent Labs Lab 08/30/15 1828  LIPASE 23   No results for input(s): AMMONIA in the last 168 hours.  CBC:  Recent Labs Lab 08/27/15 1429 08/30/15 1746 08/30/15 2213 08/31/15 0500 09/01/15 0224  WBC 15.4* 14.2* 13.2* 13.1* 10.6  NEUTROABS 11.3*  --   --   --   --   HGB  --  10.6* 10.5* 10.6* 10.4*  HCT 37.5 30.7* 30.1* 30.5* 29.7*  MCV 90 86.8 86.9 87.6 87.2  PLT 518* 438 404 426 339    Cardiac Enzymes:  Recent Labs Lab 08/30/15 1828  TROPONINI 0.06*    BNP: Invalid input(s): POCBNP  CBG:  Recent Labs Lab 08/30/15 2204  GLUCAP 130*    Microbiology: Results for orders placed or performed during the hospital encounter of 08/30/15  Culture, blood (Routine X 2) w Reflex to ID Panel     Status: None (Preliminary result)   Collection Time: 08/30/15  6:27 PM  Result Value Ref Range Status   Specimen Description BLOOD LEFT ANTECUBITAL  Final   Special Requests BOTTLES DRAWN AEROBIC AND ANAEROBIC  5CC  Final   Culture NO GROWTH 2 DAYS  Final   Report Status PENDING  Incomplete  Culture, blood (Routine X 2) w Reflex to ID Panel     Status: None (Preliminary result)   Collection Time: 08/30/15  6:28 PM  Result Value Ref Range Status   Specimen Description BLOOD RIGHT HAND  Final   Special Requests BOTTLES DRAWN AEROBIC AND ANAEROBIC  [place]  Final   Culture NO GROWTH 2 DAYS  Final   Report Status PENDING  Incomplete  MRSA PCR Screening     Status: None   Collection Time: 08/30/15 10:01 PM  Result Value Ref Range Status   MRSA by PCR NEGATIVE NEGATIVE Final    Comment:        The GeneXpert MRSA Assay (FDA approved for NASAL specimens only), is one component of a comprehensive MRSA colonization surveillance program. It is not intended to diagnose MRSA infection nor to  guide or monitor treatment for MRSA infections.     Coagulation Studies: No results for input(s): LABPROT, INR in the last 72 hours.  Urinalysis:  Recent Labs  08/31/15 1044  COLORURINE YELLOW*  LABSPEC 1.018  PHURINE 6.0  GLUCOSEU NEGATIVE  HGBUR NEGATIVE  BILIRUBINUR NEGATIVE  KETONESUR NEGATIVE  PROTEINUR >500*  NITRITE NEGATIVE  LEUKOCYTESUR NEGATIVE      Imaging: Dg Chest Port 1 View  08/31/2015  CLINICAL DATA:  PICC line placement EXAM: PORTABLE CHEST 1 VIEW COMPARISON:  08/30/2015 FINDINGS: Cardiomediastinal silhouette is stable. Again noted left perihilar infiltrate or infiltrative process. There is right arm PICC line with tip coiled mid SVC going cephalad most likely within azygos vein. Retraction about 3 cm and reposition is recommended. There is no pneumothorax. IMPRESSION: Again noted left perihilar infiltrate or infiltrative process. There is right arm PICC line with tip coiled mid SVC going cephalad most likely within azygos vein. Retraction about 3 cm and reposition is recommended. There is no pneumothorax. These results were called by telephone at the time of interpretation on 08/31/2015 at 3:43 pm to Dr. Gladstone Lighter , who verbally acknowledged these results. Electronically Signed   By: Lahoma Crocker M.D.   On: 08/31/2015 15:43   Dg Chest Port 1 View  08/31/2015  CLINICAL DATA:  Interval re- adjustment right upper extremity PICC line. EXAM: PORTABLE CHEST 1 VIEW COMPARISON:  Chest radiograph earlier same day ; chest radiograph 08/23/2015 FINDINGS: Right upper extremity PICC line is present with tip projecting in the expected location of the superior cavoatrial junction. Multiple monitoring leads overlie the patient. Stable cardiac and mediastinal contours. Re- demonstrated heterogeneous opacities within the left mid and lower lung. No pleural effusion or pneumothorax. Regional skeleton is unremarkable. IMPRESSION: Re- demonstrated heterogeneous opacities left mid and  lower lung concerning for pneumonia in the appropriate clinical setting. Followup PA and lateral chest X-ray is recommended in 3-4 weeks following  trial of antibiotic therapy to ensure resolution and exclude underlying malignancy. Right upper extremity PICC line tip terminates in the superior cavoatrial junction. Electronically Signed   By: Lovey Newcomer M.D.   On: 08/31/2015 15:41     Medications:     . bisacodyl  10 mg Rectal Daily  . enoxaparin (LOVENOX) injection  40 mg Subcutaneous Q24H  . feeding supplement (ENSURE ENLIVE)  237 mL Oral BID BM  . fenofibrate  160 mg Oral Daily  . finasteride  5 mg Oral Daily  . fluticasone furoate-vilanterol  2 puff Inhalation Daily  . levofloxacin  750 mg Oral Daily  . [START ON 09/03/2015] levothyroxine  100 mcg Oral QAC breakfast  . loratadine  10 mg Oral Daily  . multivitamin with minerals  1 tablet Oral Daily  . pantoprazole  40 mg Oral Daily  . polyethylene glycol  17 g Oral Daily  . pravastatin  80 mg Oral Daily  . predniSONE  10 mg Oral Daily  . senna  1 tablet Oral Daily  . sodium phosphate  1 enema Rectal Once  . tiotropium  18 mcg Inhalation BID   acetaminophen **OR** acetaminophen, HYDROcodone-acetaminophen, ipratropium-albuterol, ondansetron **OR** ondansetron (ZOFRAN) IV  Assessment/ Plan:  75 y.o. male  with a PMHx of bladder cancer, hyperthyroidism, history of TIA, asthma, hypertension, COPD long-standing tobacco abuse, who was admitted to [facility] on 08/30/2015 for evaluation of generalized weakness. Patient on a possible postobstructive pneumonia with mediastinal adenopathyand liver lesions highly suspicious for underlyinglung cancer.  1. Hyponatremia, due to SIADH. High urine osm, low serum osm, low uric acid all suggestive of SIADH. 2. Mediastinal adenopathy. 3. Liver lesions, possible metastatic disease. 4. Bacterial pneumonia, most likely postobstructive in nature.   Plan:  The patient's SIADH has responded nicely to 3%  saline. Since the sodium is up to 126 we will discontinue 3% saline in favor of salt tablets and Lasix as well as 1200 cc fluid restriction. I'm hesitant to give him tolvaptan given the liver lesions at this point in time. Continue to monitor serum sodium closely.  Overall the patient's condition has improved since there has been some correction of the serum sodium. Awaiting either bronchoscopy versus liver mass biopsy. We will continue to monitor along with you.    LOS: 3 Loeta Herst 5/7/201712:09 PM

## 2015-09-02 NOTE — Progress Notes (Signed)
[place] [facility] [place] NAME: Bruce May    MR#:  [id number]  DATE OF BIRTH:  [birth year]  SUBJECTIVE:   Mental status much improved and sodium 125 this morning. Family at bedside. Family asking if pt. Is having a procedure tomorrow.    REVIEW OF SYSTEMS:  Review of Systems  Constitutional: Positive for malaise/fatigue. Negative for fever and chills.  HENT: Positive for hearing loss. Negative for congestion, ear discharge and nosebleeds.   Eyes: Negative for blurred vision and double vision.  Respiratory: Negative for cough, shortness of breath and wheezing.   Cardiovascular: Negative for chest pain, palpitations and leg swelling.  Gastrointestinal: Negative for nausea, vomiting, abdominal pain, diarrhea and constipation.  Genitourinary: Negative for dysuria and urgency.  Musculoskeletal: Positive for myalgias. Negative for back pain.  Neurological: Negative for dizziness, focal weakness, seizures and headaches.  Psychiatric/Behavioral: Negative for depression.    DRUG ALLERGIES:  No Known Allergies  VITALS:  Blood pressure 176/73, pulse 78, temperature 97.9 F (36.6 C), temperature source Oral, resp. rate 16, height '5\' 9"'$  (1.753 m), weight 70.1 kg (154 lb 8.7 oz), SpO2 95 %.  PHYSICAL EXAMINATION:  Physical Exam  GENERAL:  75 y.o.-year-old patient lying in the bed in no acute distress. Very Hard of hearing. EYES: Pupils equal, round, reactive to light and accommodation. No scleral icterus. Extraocular muscles intact.  HEENT: Head atraumatic, normocephalic. Oropharynx and nasopharynx clear.  NECK:  Supple, no jugular venous distention. No thyroid enlargement, no tenderness.  LUNGS: Normal breath sounds bilaterally, no wheezing, rales,rhonchi or crepitation. No use of accessory muscles of respiration. Decreased right basilar breath sounds. CARDIOVASCULAR: S1, S2 normal. No  rubs, or gallops. 3/6 systolic murmur is present. ABDOMEN:  Soft, nontender, nondistended. Bowel sounds present. No organomegaly or mass.  EXTREMITIES: No pedal edema, cyanosis, or clubbing.  NEUROLOGIC: Cranial nerves II through XII are intact. No focal motor or sensory deficits patient bilaterally. Globally weak.  PSYCHIATRIC: The patient is alert and oriented x 3.  SKIN: No obvious rash, lesion, or ulcer.    LABORATORY PANEL:   CBC  Recent Labs Lab 09/01/15 0224  WBC 10.6  HGB 10.4*  HCT 29.7*  PLT 339   ------------------------------------------------------------------------------------------------------------------  Chemistries   Recent Labs Lab 08/30/15 1746  09/01/15 0224  09/02/15 0919  NA 115*  < > 119*  < > 126*  K 4.9  < > 4.2  --   --   CL 86*  < > 93*  --   --   CO2 21*  < > 21*  --   --   GLUCOSE 121*  < > 104*  --   --   BUN 19  < > 15  --   --   CREATININE 0.81  < > 0.71  --   --   CALCIUM 8.4*  < > 7.8*  --   --   AST 41  --   --   --   --   ALT 23  --   --   --   --   ALKPHOS 58  --   --   --   --   BILITOT 0.6  --   --   --   --   < > = values in this interval not displayed. ------------------------------------------------------------------------------------------------------------------  Cardiac Enzymes  Recent Labs Lab 08/30/15 1828  TROPONINI 0.06*   ------------------------------------------------------------------------------------------------------------------  RADIOLOGY:  Dg Chest Port 1 View  08/31/2015  CLINICAL  DATA:  PICC line placement EXAM: PORTABLE CHEST 1 VIEW COMPARISON:  08/30/2015 FINDINGS: Cardiomediastinal silhouette is stable. Again noted left perihilar infiltrate or infiltrative process. There is right arm PICC line with tip coiled mid SVC going cephalad most likely within azygos vein. Retraction about 3 cm and reposition is recommended. There is no pneumothorax. IMPRESSION: Again noted left perihilar infiltrate or infiltrative process. There is right arm PICC line with tip coiled  mid SVC going cephalad most likely within azygos vein. Retraction about 3 cm and reposition is recommended. There is no pneumothorax. These results were called by telephone at the time of interpretation on 08/31/2015 at 3:43 pm to Dr. Gladstone Lighter , who verbally acknowledged these results. Electronically Signed   By: Lahoma Crocker M.D.   On: 08/31/2015 15:43   Dg Chest Port 1 View  08/31/2015  CLINICAL DATA:  Interval re- adjustment right upper extremity PICC line. EXAM: PORTABLE CHEST 1 VIEW COMPARISON:  Chest radiograph earlier same day ; chest radiograph 08/23/2015 FINDINGS: Right upper extremity PICC line is present with tip projecting in the expected location of the superior cavoatrial junction. Multiple monitoring leads overlie the patient. Stable cardiac and mediastinal contours. Re- demonstrated heterogeneous opacities within the left mid and lower lung. No pleural effusion or pneumothorax. Regional skeleton is unremarkable. IMPRESSION: Re- demonstrated heterogeneous opacities left mid and lower lung concerning for pneumonia in the appropriate clinical setting. Followup PA and lateral chest X-ray is recommended in 3-4 weeks following trial of antibiotic therapy to ensure resolution and exclude underlying malignancy. Right upper extremity PICC line tip terminates in the superior cavoatrial junction. Electronically Signed   By: Lovey Newcomer M.D.   On: 08/31/2015 15:41    ASSESSMENT AND PLAN:   75 year old male with past medical history significant for history of bladder cancer, TIA, GERD, hypertension, COPD and history of smoking presents to the hospital secondary to weakness and noted to have sodium of 115.  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 -Appreciate nephrology input. Sodium up to 125 today and will d/c 3% saline for now.  -Continuous to follow sodium closely. Mental status much improved. No plans for Tolvaptan for now.   Discussed w/ Nephrology.   #2 COPD with community-acquired pneumonia-possible postobstructive pneumonitis suspected based on CT scan. - cont. Prednisone, Levaquin.  - cont. Breo-ellipta, Spiriva.   #3 left lung mass noted on CT chest-with prior history of smoking. Also liver lesions noted. -Appreciate pulmonary input and discussed w/ Dr. Humphrey Rolls and plan for Bronchoscopy tomorrow.  -Also seen by oncology in agreement with pulmonary evaluation and plan for possible PET scan as an outpatient. If Bronchoscopy is (-) then consider CT guided Liver biopsy as outpatient.   #4 BPH-continue finasteride  #5 hyperlipidemia-continue Pravachol, fenofibrate.  #6 GERD-continue Protonix.  Pt eval noted and will arrange for Home health services upon discharge.   Possible d/c tomorrow if sodium improved today and after Bronchoscopy.   All the records are reviewed and case discussed with Care Management/Social Workerr. Management plans discussed with the patient, family and they are in agreement.  CODE STATUS: Full code  DVT Prophylaxis - Lovenox.   TOTAL TIME TAKING CARE OF THIS PATIENT: 30 minutes.   POSSIBLE D/C IN 1-2 DAYS, DEPENDING ON CLINICAL CONDITION.  Greater than 50% time spent in coordinating care and discussing plan with the family, pulmonologist.   Henreitta Leber M.D on 09/02/2015 at 12:49 PM  Between 7am to 6pm -  Pager - [phone]  After 6pm go to [email or web address] - password [email or web address] [facility]  Office  [phone]  CC: Primary care physician; Lelon Huh, MD

## 2015-09-03 LAB — BASIC METABOLIC PANEL
ANION GAP: 5 (ref 5–15)
BUN: 21 mg/dL — ABNORMAL HIGH (ref 6–20)
CHLORIDE: 100 mmol/L — AB (ref 101–111)
CO2: 25 mmol/L (ref 22–32)
Calcium: 7.6 mg/dL — ABNORMAL LOW (ref 8.9–10.3)
Creatinine, Ser: 0.98 mg/dL (ref 0.61–1.24)
GFR calc non Af Amer: >60 mL/min (ref >60)
GLUCOSE: 154 mg/dL — AB (ref 65–99)
Potassium: 3.6 mmol/L (ref 3.5–5.1)
Sodium: 130 mmol/L — ABNORMAL LOW (ref 135–145)

## 2015-09-03 LAB — CEA: CEA: 9.5 ng/mL — ABNORMAL HIGH (ref 0.0–4.7)

## 2015-09-03 LAB — SODIUM: Sodium: 132 mmol/L — ABNORMAL LOW (ref 135–145)

## 2015-09-03 MED ORDER — FUROSEMIDE 40 MG PO TABS: 40.0000 mg | ORAL_TABLET | Freq: Every day | ORAL | Status: DC

## 2015-09-03 MED ORDER — PHENYLEPHRINE HCL 0.25 % NA SOLN
1.0000 | Freq: Four times a day (QID) | NASAL | Status: DC | PRN
  Filled 2015-09-03: qty 15

## 2015-09-03 MED ORDER — BUTAMBEN-TETRACAINE-BENZOCAINE 2-2-14 % EX AERO
1.0000 | INHALATION_SPRAY | Freq: Once | CUTANEOUS | Status: DC
  Filled 2015-09-03: qty 20

## 2015-09-03 MED ORDER — LIDOCAINE HCL 2 % EX GEL
1.0000 "application " | Freq: Once | CUTANEOUS | Status: DC
  Filled 2015-09-03: qty 5

## 2015-09-03 MED ORDER — SODIUM CHLORIDE 0.45 % IV SOLN: Freq: Once | INTRAVENOUS | Status: DC

## 2015-09-03 MED ORDER — SODIUM CHLORIDE 1 G PO TABS: 1.0000 g | ORAL_TABLET | Freq: Two times a day (BID) | ORAL | Status: DC

## 2015-09-03 NOTE — Care Management (Signed)
Discharge to home today per Dr. Heron Sabins. Will be followed by [facility]. Will update Floydene Flock, [facility] care representative. Family/friends will transport. Shelbie Ammons RN MSN CCM Care Management [phone]

## 2015-09-03 NOTE — Discharge Summary (Signed)
[facility] NAME: Bruce May    MR#:  [id number]  DATE OF BIRTH:  January 01, 1941  DATE OF ADMISSION:  08/30/2015 ADMITTING PHYSICIAN: Bettey Costa, MD  DATE OF DISCHARGE: 09/03/2015  PRIMARY CARE PHYSICIAN: Lelon Huh, MD    ADMISSION DIAGNOSIS:  Hyponatremia [E87.1] Lung mass [R91.8] Liver mass [R16.0] Altered mental status, unspecified altered mental status type [R41.82]  DISCHARGE DIAGNOSIS:  Active Problems:   Hyponatremia   SECONDARY DIAGNOSIS:   Past Medical History  Diagnosis Date  . Malignant neoplasm of bladder ([place])   . Hyperthyroidism   . Nodular prostate with urinary retention   . TIA (transient ischemic attack)   . Acid reflux   . Asthma   . Hypertension   . Malignant neoplasm of lateral wall of urinary bladder ([place])   . Temporary cerebral vascular dysfunction 07/29/2014  . Airway hyperreactivity 07/29/2014  . 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 COPD, mild ([place]) 10/11/2014    HOSPITAL COURSE:   75 year old male with past medical history significant for history of bladder cancer, TIA, GERD, hypertension, COPD and history of smoking presents to the hospital secondary to weakness and noted to have sodium of 115.  #1 hyponatremia-Patient presented to the hospital with sodium of 115. 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 -Patient was seen by nephrology and started on 3% saline. After being on 3% saline for a couple days his sodium did improve. The 3% saline was stopped and he was started on Lasix and salt tabs. His sodium on the day of discharge is as high as 132 and is clinically asymptomatic. He's therefore being discharged home with follow-up with primary care physician as an outpatient.  #2 COPD with community-acquired pneumonia- postobstructive pneumonitis suspected based on CT scan. - pt. Will cont. And finish his Pred. Taper and Levaquin.  He  was maintained on that while in the hospital.  - he will cont. Breo-ellipta, Spiriva.   #3 left lung mass noted on CT chest-with prior history of smoking. Also liver lesions noted. -Patient was seen by pulmonary and the plan to do a bronchoscopy but is going to be done as an outpatient within the next week. Patient will follow-up with Dr. Humphrey Rolls as an outpatient. -Patient was Also seen by oncology and they recommend doing a PET scan/CT-guided liver biopsy if the bronchoscopy shows no evidence of malignancy.  #4 BPH-he will continue finasteride  #5 hyperlipidemia-he will continue Pravachol, fenofibrate.  #6 GERD-he will continue omeprazole  DISCHARGE CONDITIONS:   Stable  CONSULTS OBTAINED:  Treatment Team:  Anthonette Legato, MD Allyne Gee, MD Lequita Asal, MD  DRUG ALLERGIES:  No Known Allergies  DISCHARGE MEDICATIONS:   Current Discharge Medication List    START taking these medications   Details  furosemide (LASIX) 40 MG tablet Take 1 tablet (40 mg total) by mouth daily. Qty: 30 tablet, Refills: 1    sodium chloride 1 g tablet Take 1 tablet (1 g total) by mouth 2 (two) times daily with a meal. Qty: 60 tablet, Refills: 1      CONTINUE these medications which have NOT CHANGED   Details  albuterol (PROVENTIL HFA;VENTOLIN HFA) 108 (90 BASE) MCG/ACT inhaler Inhale 2 puffs into the lungs 4 (four) times daily as needed.     cetirizine (ZYRTEC) 10 MG tablet Take 10 mg by mouth daily.    CHANTIX STARTING MONTH PAK 0.5 MG  X 11 & 1 MG X 42 tablet     fenofibrate 160 MG tablet Take 1 tablet (160 mg total) by mouth daily. Qty: 90 tablet, Refills: 3    fenoprofen (NALFON) 600 MG TABS tablet Take 600 mg by mouth daily.    finasteride (PROSCAR) 5 MG tablet Take 1 tablet (5 mg total) by mouth daily. Qty: 90 tablet, Refills: 3   Associated Diagnoses: BPH (benign prostatic hyperplasia)    fluticasone furoate-vilanterol (BREO ELLIPTA) 200-25 MCG/INH AEPB Inhale 2 puffs into  the lungs daily.    HYDROcodone-acetaminophen (NORCO/VICODIN) 5-325 MG tablet Take 1 tablet by mouth every 6 (six) hours as needed.     levofloxacin (LEVAQUIN) 750 MG tablet Take 750 mg by mouth daily.     levothyroxine (SYNTHROID, LEVOTHROID) 100 MCG tablet Take 100 mcg by mouth daily before breakfast.    Multiple Vitamin (MULTIVITAMIN WITH MINERALS) TABS tablet Take 1 tablet by mouth daily.    nystatin (MYCOSTATIN) 100000 UNIT/ML suspension Take 5 mLs by mouth 4 (four) times daily.    Omega-3 Fatty Acids (FISH OIL EXTRA STRENGTH) 1200 MG CAPS Take by mouth.    omeprazole (PRILOSEC) 40 MG capsule TAKE ONE CAPSULE BY MOUTH ONCE DAILY AS NEEDED Qty: 90 capsule, Refills: 0    pravastatin (PRAVACHOL) 80 MG tablet TAKE ONE TABLET BY MOUTH ONCE DAILY Qty: 90 tablet, Refills: 3    predniSONE (STERAPRED UNI-PAK 21 TAB) 10 MG (21) TBPK tablet Take 10 mg by mouth daily.    tiotropium (SPIRIVA) 18 MCG inhalation capsule Place 18 mcg into inhaler and inhale 2 (two) times daily.       STOP taking these medications     aspirin 325 MG EC tablet          DISCHARGE INSTRUCTIONS:   DIET:  Cardiac diet w/ fluid restriction.   DISCHARGE CONDITION:  Stable  ACTIVITY:  Activity as tolerated  OXYGEN:  Home Oxygen: No.   Oxygen Delivery: room air  DISCHARGE LOCATION:  Home with home health nursing and physical therapy   If you experience worsening of your admission symptoms, develop shortness of breath, life threatening emergency, suicidal or homicidal thoughts you must seek medical attention immediately by calling 911 or calling your MD immediately  if symptoms less severe.  You Must read complete instructions/literature along with all the possible adverse reactions/side effects for all the Medicines you take and that have been prescribed to you. Take any new Medicines after you have completely understood and accpet all the possible adverse reactions/side effects.   Please  note  You were cared for by a hospitalist during your hospital stay. If you have any questions about your discharge medications or the care you received while you were in the hospital after you are discharged, you can call the unit and asked to speak with the hospitalist on call if the hospitalist that took care of you is not available. Once you are discharged, your primary care physician will handle any further medical issues. Please note that NO REFILLS for any discharge medications will be authorized once you are discharged, as it is imperative that you return to your primary care physician (or establish a relationship with a primary care physician if you do not have one) for your aftercare needs so that they can reassess your need for medications and monitor your lab values.     Today   Mental status improved.  No complaints.  Sodium up to 132 today. Family at bedside.   VITAL  SIGNS:  Blood pressure 159/62, pulse 82, temperature 98.1 F (36.7 C), temperature source Oral, resp. rate 17, height '5\' 9"'$  (1.753 m), weight 70.1 kg (154 lb 8.7 oz), SpO2 94 %.  I/O:  No intake or output data in the 24 hours ending 09/03/15 1321  PHYSICAL EXAMINATION:  GENERAL:  75 y.o.-year-old patient lying in the bed with no acute distress.  EYES: Pupils equal, round, reactive to light and accommodation. No scleral icterus. Extraocular muscles intact.  HEENT: Head atraumatic, normocephalic. Oropharynx and nasopharynx clear.  NECK:  Supple, no jugular venous distention. No thyroid enlargement, no tenderness.  LUNGS: Normal breath sounds bilaterally, no wheezing, rales,rhonchi. No use of accessory muscles of respiration.  CARDIOVASCULAR: S1, S2 normal. No murmurs, rubs, or gallops.  ABDOMEN: Soft, non-tender, non-distended. Bowel sounds present. No organomegaly or mass.  EXTREMITIES: No pedal edema, cyanosis, or clubbing.  NEUROLOGIC: Cranial nerves II through XII are intact. No focal motor or sensory defecits  b/l.  PSYCHIATRIC: The patient is alert and oriented x 3.   SKIN: No obvious rash, lesion, or ulcer.   DATA REVIEW:   CBC  Recent Labs Lab 09/01/15 0224  WBC 10.6  HGB 10.4*  HCT 29.7*  PLT 339    Chemistries   Recent Labs Lab 08/30/15 1746  09/03/15 0457 09/03/15 0945  NA 115*  < > 130* 132*  K 4.9  < > 3.6  --   CL 86*  < > 100*  --   CO2 21*  < > 25  --   GLUCOSE 121*  < > 154*  --   BUN 19  < > 21*  --   CREATININE 0.81  < > 0.98  --   CALCIUM 8.4*  < > 7.6*  --   AST 41  --   --   --   ALT 23  --   --   --   ALKPHOS 58  --   --   --   BILITOT 0.6  --   --   --   < > = values in this interval not displayed.  Cardiac Enzymes  Recent Labs Lab 08/30/15 1828  TROPONINI 0.06*    Microbiology Results  Results for orders placed or performed during the hospital encounter of 08/30/15  Culture, blood (Routine X 2) w Reflex to ID Panel     Status: None (Preliminary result)   Collection Time: 08/30/15  6:27 PM  Result Value Ref Range Status   Specimen Description BLOOD LEFT ANTECUBITAL  Final   Special Requests BOTTLES DRAWN AEROBIC AND ANAEROBIC  5CC  Final   Culture NO GROWTH 4 DAYS  Final   Report Status PENDING  Incomplete  Culture, blood (Routine X 2) w Reflex to ID Panel     Status: None (Preliminary result)   Collection Time: 08/30/15  6:28 PM  Result Value Ref Range Status   Specimen Description BLOOD RIGHT HAND  Final   Special Requests BOTTLES DRAWN AEROBIC AND ANAEROBIC  [place]  Final   Culture NO GROWTH 4 DAYS  Final   Report Status PENDING  Incomplete  MRSA PCR Screening     Status: None   Collection Time: 08/30/15 10:01 PM  Result Value Ref Range Status   MRSA by PCR NEGATIVE NEGATIVE Final    Comment:        The GeneXpert MRSA Assay (FDA approved for NASAL specimens only), is one component of a comprehensive MRSA colonization surveillance program. It is not intended  to diagnose MRSA infection nor to guide or monitor treatment for MRSA  infections.     RADIOLOGY:  No results found.    Management plans discussed with the patient, family and they are in agreement.  CODE STATUS:     Code Status Orders        Start     Ordered   08/30/15 2201  Full code   Continuous     08/30/15 2200    Code Status History    Date Active Date Inactive Code Status Order ID Comments User Context   This patient has a current code status but no historical code status.      TOTAL TIME TAKING CARE OF THIS PATIENT: 40 minutes.    Henreitta Leber M.D on 09/03/2015 at 1:21 PM  Between 7am to 6pm - Pager - [phone]  After 6pm go to [email or web address] - password [email or web address] [facility]  Office  [phone]  CC: Primary care physician; Lelon Huh, MD

## 2015-09-03 NOTE — Care Management Important Message (Signed)
Important Message  Patient Details  Name: Treston Coker MRN: [id number] Date of Birth: [birth year]   Medicare Important Message Given:  Yes    Juliann Pulse A Lesley Atkin 09/03/2015, 10:41 AM

## 2015-09-03 NOTE — Progress Notes (Signed)
Discharge instructions given and went over with patient and family at bedside. Prescriptions given. All questions answered. Patient discharged home with wife and home health via wheelchair by nursing staff. Madlyn Frankel, RN

## 2015-09-03 NOTE — Progress Notes (Signed)
[facility]  ROUNDING NOTE   Subjective:   Na 132  Family at bedside.  Objective:  Vital signs in last 24 hours:  Temp:  [97.6 F (36.4 C)-98.1 F (36.7 C)] 98.1 F (36.7 C) (05/08 0509) Pulse Rate:  [82-88] 82 (05/08 0509) Resp:  [17-18] 17 (05/08 0509) BP: (143-159)/(61-72) 159/62 mmHg (05/08 0509) SpO2:  [94 %-97 %] 94 % (05/08 0509) FiO2 (%):  [21 %] 21 % (05/07 1518)  Weight change:  Filed Weights   08/30/15 1740 08/30/15 2200  Weight: 68.04 kg (150 lb) 70.1 kg (154 lb 8.7 oz)    Intake/Output: I/O last 3 completed shifts: In: 480 [P.O.:240; I.V.:240] Out: -    Intake/Output this shift:     Physical Exam: General: NAD, standing in room  Head: Normocephalic, atraumatic. Moist oral mucosal membranes  Eyes: Anicteric  Neck: Supple, trachea midline  Lungs:  Clear bilaterally  Heart: Regular rate and rhythm   Abdomen:  Soft, nontender, BS present  Extremities: no peripheral edema.  Neurologic: Nonfocal, moving all four extremities  Skin: No lesions       Basic Metabolic Panel:  Recent Labs Lab 08/27/15 1429  08/30/15 1746 08/30/15 2213 08/31/15 0500  09/01/15 0224  09/02/15 1307 09/02/15 1611 09/02/15 2219 09/03/15 0457 09/03/15 0945  NA 117*  < > 115* 117* 117*  < > 119*  < > 127* 127* 128* 130* 132*  K 5.1  --  4.9  --  4.0  --  4.2  --   --   --   --  3.6  --   CL 79*  --  86*  --  89*  --  93*  --   --   --   --  100*  --   CO2 23  --  21*  --  20*  --  21*  --   --   --   --  25  --   GLUCOSE 98  --  121*  --  100*  --  104*  --   --   --   --  154*  --   BUN 18  --  19  --  19  --  15  --   --   --   --  21*  --   CREATININE 1.03  --  0.81 0.83 0.79  --  0.71  --   --   --   --  0.98  --   CALCIUM 8.9  --  8.4*  --  7.8*  --  7.8*  --   --   --   --  7.6*  --   < > = values in this interval not displayed.  Liver Function Tests:  Recent Labs Lab 08/27/15 1429 08/30/15 1746  AST 40 41  ALT 21 23  ALKPHOS 76 58   BILITOT 0.5 0.6  PROT 6.2 6.3*  ALBUMIN 3.4* 3.0*    Recent Labs Lab 08/30/15 1828  LIPASE 23   No results for input(s): AMMONIA in the last 168 hours.  CBC:  Recent Labs Lab 08/27/15 1429 08/30/15 1746 08/30/15 2213 08/31/15 0500 09/01/15 0224  WBC 15.4* 14.2* 13.2* 13.1* 10.6  NEUTROABS 11.3*  --   --   --   --   HGB  --  10.6* 10.5* 10.6* 10.4*  HCT 37.5 30.7* 30.1* 30.5* 29.7*  MCV 90 86.8 86.9 87.6 87.2  PLT 518* 438 404 426 339    Cardiac  Enzymes:  Recent Labs Lab 08/30/15 1828  TROPONINI 0.06*    BNP: Invalid input(s): POCBNP  CBG:  Recent Labs Lab 08/30/15 2204  GLUCAP 130*    Microbiology: Results for orders placed or performed during the hospital encounter of 08/30/15  Culture, blood (Routine X 2) w Reflex to ID Panel     Status: None (Preliminary result)   Collection Time: 08/30/15  6:27 PM  Result Value Ref Range Status   Specimen Description BLOOD LEFT ANTECUBITAL  Final   Special Requests BOTTLES DRAWN AEROBIC AND ANAEROBIC  5CC  Final   Culture NO GROWTH 3 DAYS  Final   Report Status PENDING  Incomplete  Culture, blood (Routine X 2) w Reflex to ID Panel     Status: None (Preliminary result)   Collection Time: 08/30/15  6:28 PM  Result Value Ref Range Status   Specimen Description BLOOD RIGHT HAND  Final   Special Requests BOTTLES DRAWN AEROBIC AND ANAEROBIC  [place]  Final   Culture NO GROWTH 3 DAYS  Final   Report Status PENDING  Incomplete  MRSA PCR Screening     Status: None   Collection Time: 08/30/15 10:01 PM  Result Value Ref Range Status   MRSA by PCR NEGATIVE NEGATIVE Final    Comment:        The GeneXpert MRSA Assay (FDA approved for NASAL specimens only), is one component of a comprehensive MRSA colonization surveillance program. It is not intended to diagnose MRSA infection nor to guide or monitor treatment for MRSA infections.     Coagulation Studies: No results for input(s): LABPROT, INR in the last 72  hours.  Urinalysis: No results for input(s): COLORURINE, LABSPEC, PHURINE, GLUCOSEU, HGBUR, BILIRUBINUR, KETONESUR, PROTEINUR, UROBILINOGEN, NITRITE, LEUKOCYTESUR in the last 72 hours.  Invalid input(s): APPERANCEUR    Imaging: No results found.   Medications:     . bisacodyl  10 mg Rectal Daily  . enoxaparin (LOVENOX) injection  40 mg Subcutaneous Q24H  . feeding supplement (ENSURE ENLIVE)  237 mL Oral BID BM  . fenofibrate  160 mg Oral Daily  . finasteride  5 mg Oral Daily  . fluticasone furoate-vilanterol  2 puff Inhalation Daily  . furosemide  40 mg Oral Daily  . levofloxacin  750 mg Oral Daily  . levothyroxine  100 mcg Oral QAC breakfast  . loratadine  10 mg Oral Daily  . multivitamin with minerals  1 tablet Oral Daily  . pantoprazole  40 mg Oral Daily  . polyethylene glycol  17 g Oral Daily  . pravastatin  80 mg Oral Daily  . predniSONE  10 mg Oral Daily  . senna  1 tablet Oral Daily  . sodium chloride  1 g Oral BID WC  . sodium phosphate  1 enema Rectal Once  . tiotropium  18 mcg Inhalation BID   acetaminophen **OR** acetaminophen, HYDROcodone-acetaminophen, ipratropium-albuterol, ondansetron **OR** ondansetron (ZOFRAN) IV  Assessment/ Plan:  75 y.o. male  with a PMHx of bladder cancer, hyperthyroidism, history of TIA, asthma, hypertension, COPD long-standing tobacco abuse, who was admitted to [facility] on 08/30/2015 for evaluation of generalized weakness. Patient on a possible postobstructive pneumonia with mediastinal adenopathyand liver lesions highly suspicious for underlyinglung cancer.  1. Hyponatremia, due to SIADH. High urine osm, low serum osm, low uric acid all suggestive of SIADH. Status post 3% saline administration.  2. Mediastinal adenopathy. 3. Liver lesions, possible metastatic disease. 4. Bacterial pneumonia, most likely postobstructive in nature.  Plan:  - Fluid restriction -  Salt tabs - Furosemide.  - will need outpatient follow up.    LOS:  Bruce May, Bruce May 5/8/201711:06 AM

## 2015-09-03 NOTE — Progress Notes (Signed)
Education provided to patient and wife concerning bronchoscopy scheduled for today. Pt has been NPO since midnight. Handouts given and questions answered, both verbalized understanding. Consent signed and placed on the chart.

## 2015-09-03 NOTE — Plan of Care (Signed)
Problem: Fluid Volume: Goal: Ability to maintain a balanced intake and output will improve Outcome: Completed/Met Date Met:  09/03/15 Patient having no difficulties tolerating PO intake. Pt up to BR with assistance to void and for BM's.  Problem: Nutrition: Goal: Adequate nutrition will be maintained Outcome: Completed/Met Date Met:  09/03/15 Patient having no difficulties tolerating PO intake.  Problem: Bowel/Gastric: Goal: Will not experience complications related to bowel motility Outcome: Completed/Met Date Met:  09/03/15 Pt up to BR with assistance to void and for BM's. Pt reported multiple loose BM's r/t lactulose which has been discontinued and no longer currently ordered.      Comments:   Pt c/o back pain, requesting PRN pain medication, given with sips of water, otherwise patient NPO since midnight for scheduled bronchoscopy today.

## 2015-09-04 ENCOUNTER — Other Ambulatory Visit: Payer: Self-pay | Admitting: Internal Medicine

## 2015-09-04 DIAGNOSIS — C3492 Malignant neoplasm of unspecified part of left bronchus or lung: Secondary | ICD-10-CM

## 2015-09-04 LAB — CULTURE, BLOOD (ROUTINE X 2)
Culture: NO GROWTH
Culture: NO GROWTH

## 2015-09-05 LAB — PTH-RELATED PEPTIDE

## 2015-09-07 ENCOUNTER — Encounter: Payer: Self-pay | Admitting: Family Medicine

## 2015-09-07 ENCOUNTER — Ambulatory Visit (INDEPENDENT_AMBULATORY_CARE_PROVIDER_SITE_OTHER): Payer: Medicare Other | Admitting: Family Medicine

## 2015-09-07 ENCOUNTER — Telehealth: Payer: Self-pay | Admitting: Family Medicine

## 2015-09-07 ENCOUNTER — Encounter
Admission: RE | Admit: 2015-09-07 | Discharge: 2015-09-07 | Disposition: A | Discharge status: Home / Self Care | Payer: Medicare Other | Source: Ambulatory Visit | Attending: Internal Medicine | Admitting: Internal Medicine

## 2015-09-07 VITALS — BP 110/60 | HR 83 | Temp 98.2°F | Resp 16

## 2015-09-07 DIAGNOSIS — C3492 Malignant neoplasm of unspecified part of left bronchus or lung: Secondary | ICD-10-CM | POA: Insufficient documentation

## 2015-09-07 DIAGNOSIS — C3490 Malignant neoplasm of unspecified part of unspecified bronchus or lung: Secondary | ICD-10-CM

## 2015-09-07 DIAGNOSIS — J189 Pneumonia, unspecified organism: Secondary | ICD-10-CM | POA: Diagnosis not present

## 2015-09-07 DIAGNOSIS — E871 Hypo-osmolality and hyponatremia: Secondary | ICD-10-CM

## 2015-09-07 DIAGNOSIS — C7951 Secondary malignant neoplasm of bone: Secondary | ICD-10-CM | POA: Insufficient documentation

## 2015-09-07 LAB — GLUCOSE, CAPILLARY: GLUCOSE-CAPILLARY: 101 mg/dL — AB (ref 65–99)

## 2015-09-07 MED ORDER — FLUDEOXYGLUCOSE F - 18 (FDG) INJECTION
11.9700 | Freq: Once | INTRAVENOUS | Status: AC | PRN
  Administered 2015-09-07: 11.97 via INTRAVENOUS

## 2015-09-07 NOTE — Progress Notes (Signed)
Patient: Bruce May Male    DOB: 09-Sep-1940   75 y.o.   MRN: [id number] Visit Date: 09/07/2015  Today's Provider: Vernie Murders, PA   Chief Complaint  Patient presents with  . Follow-up    Admission   Subjective:    HPI Bruce May is here to follow-up hospital admission. He was seen at [facility] on 05/04 and Discharge 05/08. He was diagnosed with pneumonia, and they obtained a cat scan that showed a mass in his left lung. Patient also reports that this morning he went to get PET scan. Family members are here today with him and they report that he was doing well after being discharged but he has started to feel weak since yesterday and not sleeping well. Stopped smoking and stopped using the Chantix due to weakness. Hyponatremia was corrected with use of 3% saline and followed by nephrologist during his hospitalization. Na+ level was 132 at discharge on 09-03-15.  Past Medical History  Diagnosis Date  . Malignant neoplasm of bladder ([place])   . Hyperthyroidism   . Nodular prostate with urinary retention   . TIA (transient ischemic attack)   . Acid reflux   . Asthma   . Hypertension   . Malignant neoplasm of lateral wall of urinary bladder ([place])   . Temporary cerebral vascular dysfunction 07/29/2014  . Airway hyperreactivity 07/29/2014  . 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 COPD, mild ([place]) 10/11/2014   Past Surgical History  Procedure Laterality Date  . Bladder surgery  2012  . Back surgery     Family History  Problem Relation Age of Onset  . Kidney disease Sister   . Diabetes Sister   . Bladder Cancer Neg Hx   . Prostate cancer Neg Hx    No Known Allergies   Previous Medications   ALBUTEROL (PROVENTIL HFA;VENTOLIN HFA) 108 (90 BASE) MCG/ACT INHALER    Inhale 2 puffs into the lungs 4 (four) times daily as needed.    CETIRIZINE (ZYRTEC) 10 MG TABLET    Take 10 mg by mouth daily.   CHANTIX STARTING MONTH PAK 0.5 MG X 11 & 1 MG X 42 TABLET     Reported on 09/07/2015   FENOFIBRATE 160 MG TABLET    Take 1 tablet (160 mg total) by mouth daily.   FENOPROFEN (NALFON) 600 MG TABS TABLET    Take 600 mg by mouth daily.   FINASTERIDE (PROSCAR) 5 MG TABLET    Take 1 tablet (5 mg total) by mouth daily.   FLUTICASONE FUROATE-VILANTEROL (BREO ELLIPTA) 200-25 MCG/INH AEPB    Inhale 2 puffs into the lungs daily.   FUROSEMIDE (LASIX) 40 MG TABLET    Take 1 tablet (40 mg total) by mouth daily.   HYDROCODONE-ACETAMINOPHEN (NORCO/VICODIN) 5-325 MG TABLET    Take 1 tablet by mouth every 6 (six) hours as needed.    LEVOFLOXACIN (LEVAQUIN) 750 MG TABLET    Take 750 mg by mouth daily.    LEVOTHYROXINE (SYNTHROID, LEVOTHROID) 100 MCG TABLET    Take 100 mcg by mouth daily before breakfast.   MULTIPLE VITAMIN (MULTIVITAMIN WITH MINERALS) TABS TABLET    Take 1 tablet by mouth daily.   NYSTATIN (MYCOSTATIN) 100000 UNIT/ML SUSPENSION    Take 5 mLs by mouth 4 (four) times daily. Reported on 09/07/2015   OMEGA-3 FATTY ACIDS (FISH OIL EXTRA STRENGTH) 1200 MG CAPS    Take by mouth.   OMEPRAZOLE (PRILOSEC) 40 MG CAPSULE  TAKE ONE CAPSULE BY MOUTH ONCE DAILY AS NEEDED   PRAVASTATIN (PRAVACHOL) 80 MG TABLET    TAKE ONE TABLET BY MOUTH ONCE DAILY   PREDNISONE (STERAPRED UNI-PAK 21 TAB) 10 MG (21) TBPK TABLET    Take 10 mg by mouth daily.   SODIUM CHLORIDE 1 G TABLET    Take 1 tablet (1 g total) by mouth 2 (two) times daily with a meal.   TIOTROPIUM (SPIRIVA) 18 MCG INHALATION CAPSULE    Place 18 mcg into inhaler and inhale 2 (two) times daily.     Review of Systems  Constitutional: Positive for fatigue.  HENT: Negative.   Eyes: Negative.   Respiratory: Negative for cough, chest tightness and wheezing.   Cardiovascular: Negative for chest pain, palpitations and leg swelling.  Endocrine: Positive for cold intolerance.    Social History  Substance Use Topics  . Smoking status: Current Every Day Smoker -- 1.00 packs/day    Types: Cigarettes  . Smokeless tobacco:  Not on file  . Alcohol Use: No   Objective:   BP 110/60 mmHg  Pulse 83  Temp(Src) 98.2 F (36.8 C) (Oral)  Resp 16  Wt   SpO2 96%  Physical Exam  Constitutional: He is oriented to person, place, and time. He appears well-developed and well-nourished. No distress.  HENT:  Head: Normocephalic and atraumatic.  Right Ear: Hearing normal.  Left Ear: Hearing normal.  Nose: Nose normal.  Significant hearing loss even with hearing aids.  Eyes: Conjunctivae and lids are normal. Right eye exhibits no discharge. Left eye exhibits no discharge. No scleral icterus.  Neck: Neck supple.  Cardiovascular: Normal rate and regular rhythm.   Pulmonary/Chest: Effort normal.  Questionable rhonchi that clears with a cough.  Abdominal: Soft. Bowel sounds are normal.  Lymphadenopathy:    He has no cervical adenopathy.  Neurological: He is alert and oriented to person, place, and time.  Skin: Skin is intact. No lesion and no rash noted. There is pallor.  Psychiatric: He has a normal mood and affect. His speech is normal and behavior is normal. Thought content normal.      Assessment & Plan:     1. Hyponatremia Diagnosed during hospitalization on 08-30-15 with sodium of 115 on admission. Corrected with 3% saline to 132 at discharge on 09-03-15. Still has not regained all his strength yet. Will recheck electrolytes, renal function and liver function tests. - Comprehensive metabolic panel  2. 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 Follow up PET scan this morning (ordered by Dr. Humphrey Rolls - pulmonologist) showed bronchogenic cancer with widespread metastatic disease in liver, bone and lymphatics. Recommend he proceed with oncology evaluation. Poor prognosis. - CBC with Differential/Platelet  3. Community acquired pneumonia Suspected post obstructive pneumonitis based on CT scan and bronchogenic  lung cancer. Will finish prednisone taper and Levaquin as prescribed in the hospital. Continue Breo-Ellipta and Spiriva. Recheck CBC and follow up with Dr. Humphrey Rolls as planned for bronchoscopy. - CBC with Differential/Platelet       Vernie Murders, PA  [facility]

## 2015-09-07 NOTE — Telephone Encounter (Signed)
Pt wife called stating pt discharged from [facility] on 09/03/2015 for pneumonia.  I have scheduled a hospital follow up for today/MW

## 2015-09-08 LAB — CBC WITH DIFFERENTIAL/PLATELET
BASOS: 0 %
Basophils Absolute: 0 10*3/uL (ref 0.0–0.2)
EOS (ABSOLUTE): 0.1 10*3/uL (ref 0.0–0.4)
Eos: 0 %
HEMOGLOBIN: 11.3 g/dL — AB (ref 12.6–17.7)
Hematocrit: 34.3 % — ABNORMAL LOW (ref 37.5–51.0)
IMMATURE GRANS (ABS): 0.1 10*3/uL (ref 0.0–0.1)
Immature Granulocytes: 1 %
LYMPHS: 16 %
Lymphocytes Absolute: 2.1 10*3/uL (ref 0.7–3.1)
MCH: 30.1 pg (ref 26.6–33.0)
MCHC: 32.9 g/dL (ref 31.5–35.7)
MCV: 91 fL (ref 79–97)
MONOCYTES: 5 %
Monocytes Absolute: 0.7 10*3/uL (ref 0.1–0.9)
NEUTROS ABS: 9.9 10*3/uL — AB (ref 1.4–7.0)
Neutrophils: 78 %
Platelets: 298 10*3/uL (ref 150–379)
RBC: 3.76 x10E6/uL — ABNORMAL LOW (ref 4.14–5.80)
RDW: 14.1 % (ref 12.3–15.4)
WBC: 12.9 10*3/uL — ABNORMAL HIGH (ref 3.4–10.8)

## 2015-09-08 LAB — COMPREHENSIVE METABOLIC PANEL
A/G RATIO: 1.4 (ref 1.2–2.2)
ALBUMIN: 3.4 g/dL — AB (ref 3.5–4.8)
ALT: 15 IU/L (ref 0–44)
AST: 27 IU/L (ref 0–40)
Alkaline Phosphatase: 72 IU/L (ref 39–117)
BUN / CREAT RATIO: 29 — AB (ref 10–24)
BUN: 37 mg/dL — ABNORMAL HIGH (ref 8–27)
Bilirubin Total: 0.4 mg/dL (ref 0.0–1.2)
CALCIUM: 8.9 mg/dL (ref 8.6–10.2)
CO2: 25 mmol/L (ref 18–29)
Chloride: 101 mmol/L (ref 96–106)
Creatinine, Ser: 1.29 mg/dL — ABNORMAL HIGH (ref 0.76–1.27)
GFR, EST AFRICAN AMERICAN: 62 mL/min/{1.73_m2} (ref >59)
GFR, EST NON AFRICAN AMERICAN: 53 mL/min/{1.73_m2} — AB (ref >59)
Globulin, Total: 2.5 g/dL (ref 1.5–4.5)
Glucose: 155 mg/dL — ABNORMAL HIGH (ref 65–99)
POTASSIUM: 4 mmol/L (ref 3.5–5.2)
Sodium: 141 mmol/L (ref 134–144)
TOTAL PROTEIN: 5.9 g/dL — AB (ref 6.0–8.5)

## 2015-09-10 ENCOUNTER — Ambulatory Visit
Admission: RE | Admit: 2015-09-10 | Discharge: 2015-09-10 | Disposition: A | Discharge status: Home / Self Care | Payer: Medicare Other | Source: Ambulatory Visit | Attending: Internal Medicine | Admitting: Internal Medicine

## 2015-09-10 ENCOUNTER — Encounter: Payer: Self-pay | Admitting: Anesthesiology

## 2015-09-10 ENCOUNTER — Encounter: Payer: Self-pay | Admitting: *Deleted

## 2015-09-10 ENCOUNTER — Ambulatory Visit: Payer: Medicare Other

## 2015-09-10 ENCOUNTER — Encounter
Admission: RE | Disposition: A | Discharge status: Home / Self Care | Payer: Self-pay | Source: Ambulatory Visit | Attending: Internal Medicine

## 2015-09-10 ENCOUNTER — Telehealth: Payer: Self-pay

## 2015-09-10 DIAGNOSIS — Z841 Family history of disorders of kidney and ureter: Secondary | ICD-10-CM | POA: Insufficient documentation

## 2015-09-10 DIAGNOSIS — Z79899 Other long term (current) drug therapy: Secondary | ICD-10-CM | POA: Insufficient documentation

## 2015-09-10 DIAGNOSIS — F17210 Nicotine dependence, cigarettes, uncomplicated: Secondary | ICD-10-CM | POA: Diagnosis not present

## 2015-09-10 DIAGNOSIS — J449 Chronic obstructive pulmonary disease, unspecified: Secondary | ICD-10-CM | POA: Diagnosis not present

## 2015-09-10 DIAGNOSIS — R918 Other nonspecific abnormal finding of lung field: Secondary | ICD-10-CM | POA: Diagnosis present

## 2015-09-10 DIAGNOSIS — C3412 Malignant neoplasm of upper lobe, left bronchus or lung: Secondary | ICD-10-CM | POA: Insufficient documentation

## 2015-09-10 DIAGNOSIS — Z833 Family history of diabetes mellitus: Secondary | ICD-10-CM | POA: Insufficient documentation

## 2015-09-10 DIAGNOSIS — Z8551 Personal history of malignant neoplasm of bladder: Secondary | ICD-10-CM | POA: Insufficient documentation

## 2015-09-10 DIAGNOSIS — I10 Essential (primary) hypertension: Secondary | ICD-10-CM | POA: Insufficient documentation

## 2015-09-10 DIAGNOSIS — C787 Secondary malignant neoplasm of liver and intrahepatic bile duct: Secondary | ICD-10-CM | POA: Insufficient documentation

## 2015-09-10 DIAGNOSIS — Z8673 Personal history of transient ischemic attack (TIA), and cerebral infarction without residual deficits: Secondary | ICD-10-CM | POA: Insufficient documentation

## 2015-09-10 DIAGNOSIS — C3490 Malignant neoplasm of unspecified part of unspecified bronchus or lung: Secondary | ICD-10-CM | POA: Insufficient documentation

## 2015-09-10 DIAGNOSIS — Z7982 Long term (current) use of aspirin: Secondary | ICD-10-CM | POA: Insufficient documentation

## 2015-09-10 DIAGNOSIS — E871 Hypo-osmolality and hyponatremia: Secondary | ICD-10-CM | POA: Diagnosis not present

## 2015-09-10 DIAGNOSIS — E0590 Thyrotoxicosis, unspecified without thyrotoxic crisis or storm: Secondary | ICD-10-CM | POA: Diagnosis not present

## 2015-09-10 DIAGNOSIS — Z7951 Long term (current) use of inhaled steroids: Secondary | ICD-10-CM | POA: Diagnosis not present

## 2015-09-10 DIAGNOSIS — Z9889 Other specified postprocedural states: Secondary | ICD-10-CM

## 2015-09-10 SURGERY — BRONCHOSCOPY, FLEXIBLE
Anesthesia: Moderate Sedation

## 2015-09-10 MED ORDER — FLUMAZENIL 0.5 MG/5ML IV SOLN
0.2000 mg | Freq: Once | INTRAVENOUS | Status: AC
  Administered 2015-09-10: 0.2 mg via INTRAVENOUS

## 2015-09-10 MED ORDER — FENTANYL CITRATE (PF) 100 MCG/2ML IJ SOLN
INTRAMUSCULAR | Status: AC | PRN
  Administered 2015-09-10 (×2): 50 ug via INTRAVENOUS

## 2015-09-10 MED ORDER — IPRATROPIUM-ALBUTEROL 0.5-2.5 (3) MG/3ML IN SOLN: 3.0000 mL | Freq: Once | RESPIRATORY_TRACT | Status: DC

## 2015-09-10 MED ORDER — MIDAZOLAM HCL 2 MG/2ML IJ SOLN
INTRAMUSCULAR | Status: AC | PRN
  Administered 2015-09-10: 1 mg via INTRAVENOUS
  Administered 2015-09-10: 2 mg via INTRAVENOUS

## 2015-09-10 MED ORDER — FENTANYL CITRATE (PF) 100 MCG/2ML IJ SOLN
INTRAMUSCULAR | Status: AC
  Filled 2015-09-10: qty 4

## 2015-09-10 MED ORDER — FLUMAZENIL 0.5 MG/5ML IV SOLN
INTRAVENOUS | Status: AC
  Filled 2015-09-10: qty 5

## 2015-09-10 MED ORDER — MIDAZOLAM HCL 5 MG/5ML IJ SOLN
INTRAMUSCULAR | Status: AC
  Filled 2015-09-10: qty 10

## 2015-09-10 NOTE — Discharge Instructions (Signed)
Flexible Bronchoscopy, Care After Refer to this sheet in the next few weeks. These instructions provide you with information on caring for yourself after your procedure. Your health care provider may also give you more specific instructions. Your treatment has been planned according to current medical practices, but problems sometimes occur. Call your health care provider if you have any problems or questions after your procedure.  WHAT TO EXPECT AFTER THE PROCEDURE It is normal to have the following symptoms for 24-48 hours after the procedure:   Increased cough.  Low-grade fever.  Sore throat or hoarse voice.  Small streaks of blood in your thick spit (sputum) if tissue samples were taken (biopsy). HOME CARE INSTRUCTIONS   Do not eat or drink anything for 2 hours after your procedure. Your nose and throat were numbed by medicine. If you try to eat or drink before the medicine wears off, food or drink could go into your lungs or you could burn yourself. After the numbness is gone and your cough and gag reflexes have returned, you may eat soft food and drink liquids slowly.   The day after the procedure, you can go back to your normal diet.   You may resume normal activities.   Keep all follow-up visits as directed by your health care provider. It is important to keep all your appointments, especially if tissue samples were taken for testing (biopsy). SEEK IMMEDIATE MEDICAL CARE IF:   You have increasing shortness of breath.   You become light-headed or faint.   You have chest pain.   You have any new concerning symptoms.  You cough up more than a small amount of blood.  The amount of blood you cough up increases. MAKE SURE YOU:  Understand these instructions.  Will watch your condition.  Will get help right away if you are not doing well or get worse.   This information is not intended to replace advice given to you by your health care provider. Make sure you discuss  any questions you have with your health care provider.   Document Released: 11/01/2004 Document Revised: 05/05/2014 Document Reviewed: 12/17/2012 Elsevier Interactive Patient Education [employer].

## 2015-09-10 NOTE — Telephone Encounter (Signed)
Left message to call back  

## 2015-09-10 NOTE — H&P (Signed)
Please see consult noted

## 2015-09-10 NOTE — Telephone Encounter (Signed)
Advised patient's wife of results.  

## 2015-09-10 NOTE — Telephone Encounter (Signed)
-----   Message from Margo Common, [place] sent at 09/10/2015  8:22 AM EDT ----- Sodium back to normal. Protein low but Hgb a little better. Other tests show some metabolic stress. Be sure pulmonologist and oncologist get to see this report.

## 2015-09-10 NOTE — Op Note (Signed)
[facility] Patient Name: Bruce May Procedure Date: 09/10/2015 12:10 PM MRN: [id number] Account #: [id number] Date of Birth: 02-Jul-1940 Admit Type: Outpatient Age: 75 Room: [facility] PROCEDURE RM 01 Gender: Male Note Status: Finalized Attending MD: Allyne Gee , MD Procedure:         Bronchoscopy Indications:       Left upper lobe mass, Left hilar mass, Mediastinal mass Providers:         Allyne Gee, MD Referring MD:       Medicines:         Midazolam 3 mg mg IV, Fentanyl 242 mcg IV Complications:     No immediate complications Procedure:         Pre-Anesthesia Assessment:                    - A History and Physical has been performed. The patient's                     medications, allergies and sensitivities have been                     reviewed.                    - The risks and benefits of the procedure and the sedation                     options and risks were discussed with the patient. All                     questions were answered and informed consent was obtained.                    - Pre-procedure physical examination revealed no                     contraindications to sedation.                    - ASA Grade Assessment: II - A patient with mild systemic                     disease.                    - After reviewing the risks and benefits, the patient was                     deemed in satisfactory condition to undergo the procedure.                    - The anesthesia plan was to use moderate                     sedation/analgesia.                    - Immediately prior to administration of medications, the                     patient was re-assessed for adequacy to receive sedatives.                    - The heart rate, respiratory rate, oxygen saturations,                     blood pressure, adequacy  of pulmonary ventilation, and                     response to care were monitored throughout the procedure.                    - The physical  status of the patient was re-assessed after                     the procedure.                    After obtaining informed consent, the bronchoscope was                     passed under direct vision. Throughout the procedure, the                     patient's blood pressure, pulse, and oxygen saturations                     were monitored continuously. the Bronchoscope Olympus                     [id number] was introduced through the right                     nostril and advanced to the tracheobronchial tree of both                     lungs. The procedure was accomplished without difficulty.                     The patient tolerated the procedure well. The total                     duration of the procedure was 30 minutes. Findings:      Left Lung Abnormalities: A nearly obstructing (greater than 90%       obstructed) mass was found proximally, at the orifice in the left upper       lobe. The mass was large and endobronchial. The lesion was successfully       traversed. Endobronchial biopsies were performed in the left mainstem       bronchus, in the left upper lobe and in the inferior lingula segment of       the left upper lobe using forceps and sent for routine cytology and       histopathology examination. Three samples were obtained. Brushings were       obtained in the left mainstem bronchus and sent for routine cytology.       Three samples were obtained. Impression:        - Left upper lobe mass                    - Left hilar mass                    - Mediastinal mass                    - An endobronchial mass was found in the left upper lobe.                     This lesion is malignant.                    -  An endobronchial biopsy was performed.                    - Brushings were obtained.                    - Partially obstructing (about 90% obstructed) airway                     abnormality in the left upper lobe. Recommendation:    - Await biopsy, brushing and  cytology results. Devona Konig, MD Allyne Gee, MD 09/10/2015 12:51:48 PM This report has been signed electronically. Number of Addenda: 0 Note Initiated On: 09/10/2015 12:10 PM      [facility]

## 2015-09-12 LAB — SURGICAL PATHOLOGY

## 2015-09-12 LAB — CYTOLOGY - NON PAP

## 2015-09-17 ENCOUNTER — Inpatient Hospital Stay: Payer: Medicare Other | Attending: Hematology and Oncology | Admitting: Hematology and Oncology

## 2015-09-17 ENCOUNTER — Telehealth: Payer: Self-pay | Admitting: Family Medicine

## 2015-09-17 VITALS — BP 137/78 | HR 73 | Temp 97.7°F | Resp 18 | Wt 143.0 lb

## 2015-09-17 DIAGNOSIS — R634 Abnormal weight loss: Secondary | ICD-10-CM | POA: Insufficient documentation

## 2015-09-17 DIAGNOSIS — Z8673 Personal history of transient ischemic attack (TIA), and cerebral infarction without residual deficits: Secondary | ICD-10-CM | POA: Diagnosis not present

## 2015-09-17 DIAGNOSIS — C3412 Malignant neoplasm of upper lobe, left bronchus or lung: Secondary | ICD-10-CM | POA: Diagnosis not present

## 2015-09-17 DIAGNOSIS — C7901 Secondary malignant neoplasm of right kidney and renal pelvis: Secondary | ICD-10-CM | POA: Diagnosis not present

## 2015-09-17 DIAGNOSIS — K219 Gastro-esophageal reflux disease without esophagitis: Secondary | ICD-10-CM | POA: Diagnosis not present

## 2015-09-17 DIAGNOSIS — R51 Headache: Secondary | ICD-10-CM | POA: Insufficient documentation

## 2015-09-17 DIAGNOSIS — I2510 Atherosclerotic heart disease of native coronary artery without angina pectoris: Secondary | ICD-10-CM | POA: Diagnosis not present

## 2015-09-17 DIAGNOSIS — Z8551 Personal history of malignant neoplasm of bladder: Secondary | ICD-10-CM | POA: Insufficient documentation

## 2015-09-17 DIAGNOSIS — J439 Emphysema, unspecified: Secondary | ICD-10-CM | POA: Diagnosis not present

## 2015-09-17 DIAGNOSIS — Z79899 Other long term (current) drug therapy: Secondary | ICD-10-CM | POA: Insufficient documentation

## 2015-09-17 DIAGNOSIS — J449 Chronic obstructive pulmonary disease, unspecified: Secondary | ICD-10-CM | POA: Diagnosis not present

## 2015-09-17 DIAGNOSIS — C7951 Secondary malignant neoplasm of bone: Secondary | ICD-10-CM | POA: Insufficient documentation

## 2015-09-17 DIAGNOSIS — E0590 Thyrotoxicosis, unspecified without thyrotoxic crisis or storm: Secondary | ICD-10-CM | POA: Diagnosis not present

## 2015-09-17 DIAGNOSIS — C7902 Secondary malignant neoplasm of left kidney and renal pelvis: Secondary | ICD-10-CM | POA: Diagnosis not present

## 2015-09-17 DIAGNOSIS — C787 Secondary malignant neoplasm of liver and intrahepatic bile duct: Secondary | ICD-10-CM | POA: Diagnosis not present

## 2015-09-17 DIAGNOSIS — N400 Enlarged prostate without lower urinary tract symptoms: Secondary | ICD-10-CM | POA: Diagnosis not present

## 2015-09-17 DIAGNOSIS — F17210 Nicotine dependence, cigarettes, uncomplicated: Secondary | ICD-10-CM

## 2015-09-17 DIAGNOSIS — R5383 Other fatigue: Secondary | ICD-10-CM | POA: Insufficient documentation

## 2015-09-17 DIAGNOSIS — C3492 Malignant neoplasm of unspecified part of left bronchus or lung: Secondary | ICD-10-CM

## 2015-09-17 DIAGNOSIS — R531 Weakness: Secondary | ICD-10-CM | POA: Diagnosis not present

## 2015-09-17 DIAGNOSIS — Z5111 Encounter for antineoplastic chemotherapy: Secondary | ICD-10-CM | POA: Insufficient documentation

## 2015-09-17 DIAGNOSIS — Z8701 Personal history of pneumonia (recurrent): Secondary | ICD-10-CM | POA: Diagnosis not present

## 2015-09-17 DIAGNOSIS — Z66 Do not resuscitate: Secondary | ICD-10-CM | POA: Insufficient documentation

## 2015-09-17 DIAGNOSIS — C778 Secondary and unspecified malignant neoplasm of lymph nodes of multiple regions: Secondary | ICD-10-CM | POA: Diagnosis not present

## 2015-09-17 NOTE — Progress Notes (Signed)
Bruce May  Chief Complaint: Bruce May is a 75 y.o. male with extensive stage small cell lung cancer who is seen for assessment after recent hospitalization.  HPI:  The patient was admitted to [facility] from 08/30/2015 - 09/03/2015 with hyponatremia (sodium 115).  He has a greater than 60-pack-year smoking history. Over the past 1-2 months, he became weak and fatigued. Appetite decreased. He lost about 10 pounds. He was initially treated for bronchitis. He was then diagnosed with a left lower lobe pneumonia. He was treated with Levaquin and prednisone. Antibiootics were extended as he was not doing well.  Chest CT on 08/30/2015 revealed consolidation in the medial lingula consistent with pneumonia or possibly postobstructive pneumonitis as the contours were concerning for a left perihilar mass. There was mediastinal adenopathy (1.1-1.5 cm). There were numerous low attenuation liver lesions concerning for metastatic disease. There was a small left-sided pleural effusion.  Head CT without contrast on 08/30/2015 revealed advanced microvascular ischemic disease without acute intracranial process  He was seen by nephrology for SIADH. He was treated with hypertonic saline. Sodium improved to 132 at the time of discharge.  He was discharged on salt tablets (1 gm BID) and Lasix (40 mg a day). Follow-up sodium was 141 on 09/07/2015.  He was treated with Levaquin for his pneumonia.  PET scan on 09/07/2015 revealed a hypermetabolic left upper lobe perihilar mass consistent with bronchogenic carcinoma (SUV 14.6). There was widespread metastatic disease to multiple mediastinal lymph nodes (1cm right paratracheal node with SUV 9.9: AP window node 1.2 cm with SUV 11.3; 1.9 x 3.6 cm subcarinal node with SUV 12.1), multiple hypermetabolic liver lesions, multifocal hypermetabolic bone lesions, and bilateral perinephric nodules.  Bone lesions  included proximal right humerus and bilateral proximal femurs.  There was underlying moderate emphysema and biapical scarring.  There was diffuse atherosclerosis.  Bronchoscopy on 09/10/2015 revealed a nearly obstructing (greater than 90% obstructed) endobronchial mass proximally at the orifice in the left upper lobe. Endobronchial biopsies were performed in the left mainstem bronchus, in the left upper lobe and in the inferior lingula segment of the left upper lobe.  Pathology revealed small cell carcinoma.  CD 56 and TTF-1 were diffusely and strongly positive. P40 stain was negative.    Symptomatically, he feels "fine".  He describes being fatigued.  He had a headache this morning.  His family notes some balance issues.  He denies any shortness of breath.  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 Cystoscopy in 07/2015 was negative.   Past Medical History  Diagnosis Date  . Malignant neoplasm of bladder ([place])   . Hyperthyroidism   . Nodular prostate with urinary retention   . TIA (transient ischemic attack)   . Acid reflux   . Asthma   . Hypertension   . 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 Temporary cerebral vascular dysfunction 07/29/2014  . Airway hyperreactivity 07/29/2014  . 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 Bladder surgery  2012  . Back surgery      Family History  Problem Relation Age of Onset  . Kidney disease Sister   . Diabetes Sister   . Bladder Cancer Neg Hx   . Prostate cancer Neg Hx     Social History:  reports that he has been smoking Cigarettes.  He  has been smoking about 1.00 pack per day. He does not have any smokeless tobacco history on file. He reports that he does not drink alcohol or use illicit drugs.  The patient has smoked 1 pack a day for 66 years. He started smoking at age 49. He lives with his wife in [place]. He is retired. He ran heavy equipment.  His family states that he has a 3rd grade education.  The patient is accompanied by 6 family members including his wife, 2 sons, 2 daughters, and a son-in-law today.  Allergies: No Known Allergies  Current Medications: Current Outpatient Prescriptions  Medication Sig Dispense Refill  . albuterol (PROVENTIL HFA;VENTOLIN HFA) 108 (90 BASE) MCG/ACT inhaler Inhale 2 puffs into the lungs 4 (four) times daily as needed.     . cetirizine (ZYRTEC) 10 MG tablet Take 10 mg by mouth daily.    . ciprofloxacin (CIPRO) 500 MG tablet Take 500 mg by mouth daily with breakfast.    . fenofibrate 160 MG tablet Take 1 tablet (160 mg total) by mouth daily. 90 tablet 3  . finasteride (PROSCAR) 5 MG tablet Take 1 tablet (5 mg total) by mouth daily. 90 tablet 3  . fluticasone furoate-vilanterol (BREO ELLIPTA) 200-25 MCG/INH AEPB Inhale 2 puffs into the lungs daily.    Marland Kitchen levothyroxine (SYNTHROID, LEVOTHROID) 100 MCG tablet Take 100 mcg by mouth daily before breakfast.    . mirtazapine (REMERON) 7.5 MG tablet Take 7.5 mg by mouth at bedtime.    . Multiple Vitamin (MULTIVITAMIN WITH MINERALS) TABS tablet Take 1 tablet by mouth daily.    Marland Kitchen nystatin (MYCOSTATIN) 100000 UNIT/ML suspension Take 5 mLs by mouth 4 (four) times daily. Reported on 09/07/2015    . Omega-3 Fatty Acids (FISH OIL EXTRA STRENGTH) 1200 MG CAPS Take by mouth.    Marland Kitchen omeprazole (PRILOSEC) 40 MG capsule TAKE ONE CAPSULE BY MOUTH ONCE DAILY AS NEEDED 90 capsule 0  . pravastatin (PRAVACHOL) 80 MG tablet TAKE ONE TABLET BY MOUTH ONCE DAILY 90 tablet 3  . tiotropium (SPIRIVA) 18 MCG inhalation capsule Place 18 mcg into inhaler and inhale 2 (two) times daily.      No current facility-administered medications for this visit.    Review of Systems:  GENERAL:  Feels "fine". Fatigue.  No fevers or sweats.  Weight loss of 10 pounds prior to diagnposis. PERFORMANCE STATUS (ECOG):  2 HEENT:  Decreased hearing.  No  visual changes, runny nose, sore throat, mouth sores or tenderness. Lungs: No shortness of breath or cough.  No hemoptysis. Cardiac:  No chest pain, palpitations, orthopnea, or PND. GI:  Appetite 75% normal.  No nausea, vomiting, diarrhea, constipation, melena or hematochezia. GU:  No urgency, frequency, dysuria, or hematuria.  History of bladder cancer. Musculoskeletal:  No back pain.  No joint pain.  No muscle tenderness. Extremities:  No pain or swelling. Skin:  No rashes or skin changes. Neuro:  No headache, numbness or weakness, balance or coordination issues. Endocrine:  No diabetes.  Thyroid disease on Synthroid.  No hot flashes or night sweats. Psych:  No mood changes, depression or anxiety. Pain:  No focal pain. Review of systems:  All other systems reviewed and found to be negative.  Physical Exam: Blood pressure 137/78, pulse 73, temperature 97.7 F (36.5 C), temperature source Oral, resp. rate 18, weight 143 lb (64.864 kg). GENERAL: Elderly gentleman sitting comfortably in a wheelchair in the exam room in no acute distress. MENTAL STATUS: Alert and oriented to person,  place and time. HEAD: Wearing a green cap. Thin short gray hair. Normocephalic, atraumatic, face symmetric, no Cushingoid features. EYES: Glasses. Hazel eyes. Pupils equal round and reactive to light and accomodation. No conjunctivitis or scleral icterus. ENT: Oropharynx clear without lesion. Dentures. Tongue normal.  Mucous membranes moist.  RESPIRATORY: Clear to auscultation without rales, wheezes or rhonchi. Intermittent cough.  CARDIOVASCULAR: Regular rate and rhythm without murmur, rub or gallop. ABDOMEN: Soft, non-tender, with active bowel sounds, and no hepatosplenomegaly. No masses. SKIN: No rashes, ulcers or lesions. EXTREMITIES: No edema, no skin discoloration or tenderness. No palpable cords. LYMPH NODES: No palpable cervical, supraclavicular, axillary or inguinal adenopathy   NEUROLOGICAL:  Alert & oriented, cranial nerves II-XII intact; motor strength 5/5 throughout; sensation intact; finger to nose and RAM normal; gait normal; negative Rhomberg; no clonus or Babinski. PSYCH:  Appropriate.    No visits with results within 3 Day(s) from this visit. Latest known visit with results is:  Admission on 09/10/2015, Discharged on 09/10/2015  Component Date Value Ref Range Status  . CYTOLOGY - NON GYN 09/10/2015    Final                   Value:Cytology - Non PAP CASE: [id number] PATIENT: Bruce May Non-Gyn Cytology Report     SPECIMEN SUBMITTED: A. Lung, lingular, left  CLINICAL HISTORY: Left upper lobe mass, Left hilar mass, Mediastinal mass; smoker; hyponatremia  PRE-OPERATIVE DIAGNOSIS: Lung mass  POST-OPERATIVE DIAGNOSIS: Same as pre-op     DIAGNOSIS: A. LUNG LINGULA, LEFT; BRONCHOSCOPY WITH BRUSHING: - SMALL CELL CARCINOMA.  Comment: These findings were communicated Nimisha in Dr. Laurelyn Sickle office on 09/12/2015. Read back procedure was performed.  See concurrent cases [id number] and [id number].   GROSS DESCRIPTION:  A. Site: left lingular Procedure: bronchoscopy Cytotechnologist: Rivka Barbara ,Molli Barrows Specimen(s) collected: 6 Diff Quik stained slides Specimen labeled left lingular:      Description: pink CytoLyt solution with 3 brushes      Submitted for:           ThinPrep  A surgical specimen in formalin is also submitted on the specimen in a separate report Final                          Diagnosis performed by Quay Burow, MD.  Electronically signed 09/12/2015 2:56:00PM    The electronic signature indicates that the named Attending Pathologist has evaluated the specimen  Technical component performed at [facility], [address] [place], [place] [address] Lab: [phone] Dir: Darrick Penna. Evette Doffing, MD  Professional component performed at [facility], [facility], [place], [place],  [place] [address] Lab: [phone] Dir: Dellia Nims. Reuel Derby, MD    . CYTOLOGY - NON GYN 09/10/2015    Final                   Value:Cytology - Non PAP CASE: [id number] PATIENT: Bruce May Non-Gyn Cytology Report     SPECIMEN SUBMITTED: A. Lung, lingular, left  CLINICAL HISTORY: Left upper lobe mass, Left hilar mass, Mediastinal mass; smoker; hyponatremia  PRE-OPERATIVE DIAGNOSIS: Lung mass  POST-OPERATIVE DIAGNOSIS: Same as pre-op     DIAGNOSIS: A. LUNG LINGULA, LEFT; BRONCHIAL LAVAGE: - SMALL CELL CARCINOMA.  Comment: These findings were communicated Nimisha in Dr. Laurelyn Sickle office on 09/12/2015. Read back procedure was performed.  See concurrent cases [id number] and [id number].   GROSS DESCRIPTION:  A. Site: left lingular Procedure: bronchoscopy Cytotechnologist: Molli Barrows and Rivka Barbara  Specimen(s) collected: Specimen labeled left lingular wash:      Volume:  (for lavage only) 20 mL      Description: pink fluid with wispy material      Submitted for:           ThinPrep  A forceps biopsy was obtained and will be reported in a separate ARS report. Final Diagno                         sis performed by Quay Burow, MD.  Electronically signed 09/12/2015 2:57:44PM    The electronic signature indicates that the named Attending Pathologist has evaluated the specimen  Technical component performed at [facility], [address], [place], [place] [address] Lab: [phone] Dir: Darrick Penna. Evette Doffing, MD  Professional component performed at [facility], [facility], [place], [place], [place] [address] Lab: [phone] Dir: Dellia Nims. Reuel Derby, MD    . SURGICAL PATHOLOGY 09/10/2015    Final-Edited                   Value:Surgical Pathology CASE: [id number] PATIENT: Bruce May Surgical Pathology Report     SPECIMEN SUBMITTED: A. Lung, lingular, left  CLINICAL HISTORY: Left upper lobe mass, Left hilar mass, Mediastinal mass;  smoker; hyponatremia  PRE-OPERATIVE DIAGNOSIS: Lung mass  POST-OPERATIVE DIAGNOSIS: Same as pre-op     DIAGNOSIS: A. LUNG LINGULA, LEFT; BRONCHOSCOPY WITH BIOPSY: - SMALL CELL LUNG CARCINOMA.  Comment: Immunohistochemical stains for CD 56 and TTF-1 were performed and the lesional cells are diffusely and strongly positive. P40 stain is negative. This pattern of staining supports the above diagnosis. Stain controls worked appropriately. These findings were communicated Nimisha in Dr. Laurelyn Sickle office on 09/12/2015. Read back procedure was performed.  See concurrent cases [id number] and [id number].   GROSS DESCRIPTION:  A. Labeled: left lingular  Tissue fragment(s): multiple  Size: aggregate, 0.4 x 0.2 x 0.1 cm  Description: pink-tan fragment                         s and blood clot material, wrapped in lens paper and submitted a mesh bag  Entirely submitted in 1 cassette(s).    Correction performed by Quay Burow, MD.  Electronically signed 09/12/2015 2:57:08PM   Final Diagnosis performed by Quay Burow, MD.  Electronically signed 09/12/2015 2:55:25PM   Technical component performed at [place], [address], [place], [place] [address] Lab: [phone] Dir: Darrick Penna. Evette Doffing, MD  Professional component performed at [facility], [facility], [place] [address], [place], [place] [address] Lab: [phone] Dir: Dellia Nims. Reuel Derby, MD  Technical component performed at [place], [address], [place], [place] [address] Lab: [phone] Dir: Darrick Penna. Evette Doffing, MD  Professional component performed at [facility], [facility], [place], [place], [place] [address] Lab: [phone] Dir: Dellia Nims. Reuel Derby, MD      Assessment:  Bruce May is a 75 y.o. male with extensive stage small cell lung cancer.  He presented with a 1-2 month history of progressive weakness, persistent pneumonia and hyponatremia (sodium 115) secondary to  SIADH.   PET scan on 09/07/2015 revealed a hypermetabolic left upper lobe perihilar mass consistent with bronchogenic carcinoma (SUV 14.6). There was widespread metastatic disease to multiple mediastinal lymph nodes (1cm right paratracheal node with SUV 9.9: AP window node 1.2 cm with SUV 11.3; 1.9 x 3.6 cm subcarinal node with SUV 12.1), multiple hypermetabolic liver lesions, multifocal hypermetabolic bone lesions,  and bilateral perinephric nodules.  Bone lesions included proximal right humerus and bilateral proximal femurs.   Bronchoscopy on 09/10/2015 revealed a nearly obstructing (greater than 90% obstructed) endobronchial mass proximally at the orifice in the left upper lobe. Endobronchial biopsies confirmed small cell carcinoma.    He has a history of superficial bladder cancer. Cystoscopy in 07/2015 was negative.  Symptomatically, he is fatigued.  He had a headache this morning.  His family notes some balance issues.  He denies any shortness of breath.  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 Cystoscopy in 07/2015 was negative.  Symptomatically, he is fatigued.  He has had some headaches and balance issues.  Exam including neurologic exam is normal.  Plan: 1.  Discuss diagnosis of extensive small cell lung cancer.  Review PET scan with patient and family.  Discuss natural history of the disease.  Discuss that tumor is chemotherapy sensitive, but not curable.  Discuss palliative chemotherapy with carboplatin and etoposide for 4-6 cycles with Neulasta support.  Side effects of chemotherapy were reviewed including myelosuppression, infection, nausea, vomitiing, hair loss, electrolyte wasting (potassium and magnesium), and risk of second malignancy (leukemia secondary to etoposide).   2.  Discuss prophylactic cranial radiation for patients responding well to chemotherapy secondary to risk of CNS metastasis.  Discuss need for baseline head MRI.   3.  Discuss assessing vascular access  and possible port-a-a-cath.  Nursing will assess veins today. 4.  Discuss chemotherapy class.  Patient and family will attend class on 09/20/2015. 5.  Head MRI on 09/20/2015.  Patient has had MRIs in the past.  Notes some metal in body from workshop. 6.  Plain films of right humerus and bilateral proximal femurs. 7.  Preauth carboplatin and etoposide; Xgeva. 8.  Discuss smoking cessation.  Patient not interested. 9.  Discuss code status issues.  Code status is DNR/DNI.  Packet provided patient regarding living will and medical power of attorney. 10.  RTC on 05/31 for MD assessment, labs (CBC with diff, CMP, Mg), and cycle #1 carboplatin and etoposide. 11.  Anticipate nadir check with MD on day 10 post chemotherapy.  Addendum:  Patient will require a port-a-cath.   Lequita Asal, MD  09/17/2015, 11:22 AM

## 2015-09-17 NOTE — Progress Notes (Signed)
Pt here for results. States is feeling well. Offers no complaints. Tick was found on left neck while in exam room. Tick was removed by pt's family member.

## 2015-09-17 NOTE — Patient Instructions (Signed)
Etoposide, VP-16 injection  What is this medicine?  ETOPOSIDE, VP-16 (e toe POE side) is a chemotherapy drug. It is used to treat testicular cancer, lung cancer, and other cancers.  This medicine may be used for other purposes; ask your health care provider or pharmacist if you have questions.  What should I tell my health care provider before I take this medicine?  They need to know if you have any of these conditions:  -infection  -kidney disease  -low blood counts, like low white cell, platelet, or red cell counts  -an unusual or allergic reaction to etoposide, other chemotherapeutic agents, other medicines, foods, dyes, or preservatives  -pregnant or trying to get pregnant  -breast-feeding  How should I use this medicine?  This medicine is for infusion into a vein. It is administered in a hospital or clinic by a specially trained health care professional.  Talk to your pediatrician regarding the use of this medicine in children. Special care may be needed.  Overdosage: If you think you have taken too much of this medicine contact a poison control center or emergency room at once.  NOTE: This medicine is only for you. Do not share this medicine with others.  What if I miss a dose?  It is important not to miss your dose. Call your doctor or health care professional if you are unable to keep an appointment.  What may interact with this medicine?  -aspirin  -certain medications for seizures like carbamazepine, phenobarbital, phenytoin, valproic acid  -cyclosporine  -levamisole  -warfarin  This list may not describe all possible interactions. Give your health care provider a list of all the medicines, herbs, non-prescription drugs, or dietary supplements you use. Also tell them if you smoke, drink alcohol, or use illegal drugs. Some items may interact with your medicine.  What should I watch for while using this medicine?  Visit your doctor for checks on your progress. This drug may make you feel generally unwell.  This is not uncommon, as chemotherapy can affect healthy cells as well as cancer cells. Report any side effects. Continue your course of treatment even though you feel ill unless your doctor tells you to stop.  In some cases, you may be given additional medicines to help with side effects. Follow all directions for their use.  Call your doctor or health care professional for advice if you get a fever, chills or sore throat, or other symptoms of a cold or flu. Do not treat yourself. This drug decreases your body's ability to fight infections. Try to avoid being around people who are sick.  This medicine may increase your risk to bruise or bleed. Call your doctor or health care professional if you notice any unusual bleeding.  Be careful brushing and flossing your teeth or using a toothpick because you may get an infection or bleed more easily. If you have any dental work done, tell your dentist you are receiving this medicine.  Avoid taking products that contain aspirin, acetaminophen, ibuprofen, naproxen, or ketoprofen unless instructed by your doctor. These medicines may hide a fever.  Do not become pregnant while taking this medicine or for at least 6 months after stopping it. Women should inform their doctor if they wish to become pregnant or think they might be pregnant. Women of child-bearing potential will need to have a negative pregnancy test before starting this medicine. There is a potential for serious side effects to an unborn child. Talk to your health care professional or   pharmacist for more information. Do not breast-feed an infant while taking this medicine.  Men must use a latex condom during sexual contact with a woman while taking this medicine and for at least 4 months after stopping it. A latex condom is needed even if you have had a vasectomy. Contact your doctor right away if your partner becomes pregnant. Do not donate sperm while taking this medicine and for at least 4 months after you stop  taking this medicine. Men should inform their doctors if they wish to father a child. This medicine may lower sperm counts.  What side effects may I notice from receiving this medicine?  Side effects that you should report to your doctor or health care professional as soon as possible:  -allergic reactions like skin rash, itching or hives, swelling of the face, lips, or tongue  -low blood counts - this medicine may decrease the number of white blood cells, red blood cells and platelets. You may be at increased risk for infections and bleeding.  -signs of infection - fever or chills, cough, sore throat, pain or difficulty passing urine  -signs of decreased platelets or bleeding - bruising, pinpoint red spots on the skin, black, tarry stools, blood in the urine  -signs of decreased red blood cells - unusually weak or tired, fainting spells, lightheadedness  -breathing problems  -changes in vision  -mouth or throat sores or ulcers  -pain, redness, swelling or irritation at the injection site  -pain, tingling, numbness in the hands or feet  -redness, blistering, peeling or loosening of the skin, including inside the mouth  -seizures  -vomiting  Side effects that usually do not require medical attention (report to your doctor or health care professional if they continue or are bothersome):  -diarrhea  -hair loss  -loss of appetite  -nausea  -stomach pain  This list may not describe all possible side effects. Call your doctor for medical advice about side effects. You may report side effects to FDA at 1-800-FDA-1088.  Where should I keep my medicine?  This drug is given in a hospital or clinic and will not be stored at home.  NOTE: This sheet is a summary. It may not cover all possible information. If you have questions about this medicine, talk to your doctor, pharmacist, or health care provider.      2016, Elsevier/Gold Standard. (2013-12-08 12:32:50)  Carboplatin injection  What is this medicine?  CARBOPLATIN (KAR boe  pla tin) is a chemotherapy drug. It targets fast dividing cells, like cancer cells, and causes these cells to die. This medicine is used to treat ovarian cancer and many other cancers.  This medicine may be used for other purposes; ask your health care provider or pharmacist if you have questions.  What should I tell my health care provider before I take this medicine?  They need to know if you have any of these conditions:  -blood disorders  -hearing problems  -kidney disease  -recent or ongoing radiation therapy  -an unusual or allergic reaction to carboplatin, cisplatin, other chemotherapy, other medicines, foods, dyes, or preservatives  -pregnant or trying to get pregnant  -breast-feeding  How should I use this medicine?  This drug is usually given as an infusion into a vein. It is administered in a hospital or clinic by a specially trained health care professional.  Talk to your pediatrician regarding the use of this medicine in children. Special care may be needed.  Overdosage: If you think you have taken   too much of this medicine contact a poison control center or emergency room at once.  NOTE: This medicine is only for you. Do not share this medicine with others.  What if I miss a dose?  It is important not to miss a dose. Call your doctor or health care professional if you are unable to keep an appointment.  What may interact with this medicine?  -medicines for seizures  -medicines to increase blood counts like filgrastim, pegfilgrastim, sargramostim  -some antibiotics like amikacin, gentamicin, neomycin, streptomycin, tobramycin  -vaccines  Talk to your doctor or health care professional before taking any of these medicines:  -acetaminophen  -aspirin  -ibuprofen  -ketoprofen  -naproxen  This list may not describe all possible interactions. Give your health care provider a list of all the medicines, herbs, non-prescription drugs, or dietary supplements you use. Also tell them if you smoke, drink alcohol, or  use illegal drugs. Some items may interact with your medicine.  What should I watch for while using this medicine?  Your condition will be monitored carefully while you are receiving this medicine. You will need important blood work done while you are taking this medicine.  This drug may make you feel generally unwell. This is not uncommon, as chemotherapy can affect healthy cells as well as cancer cells. Report any side effects. Continue your course of treatment even though you feel ill unless your doctor tells you to stop.  In some cases, you may be given additional medicines to help with side effects. Follow all directions for their use.  Call your doctor or health care professional for advice if you get a fever, chills or sore throat, or other symptoms of a cold or flu. Do not treat yourself. This drug decreases your body's ability to fight infections. Try to avoid being around people who are sick.  This medicine may increase your risk to bruise or bleed. Call your doctor or health care professional if you notice any unusual bleeding.  Be careful brushing and flossing your teeth or using a toothpick because you may get an infection or bleed more easily. If you have any dental work done, tell your dentist you are receiving this medicine.  Avoid taking products that contain aspirin, acetaminophen, ibuprofen, naproxen, or ketoprofen unless instructed by your doctor. These medicines may hide a fever.  Do not become pregnant while taking this medicine. Women should inform their doctor if they wish to become pregnant or think they might be pregnant. There is a potential for serious side effects to an unborn child. Talk to your health care professional or pharmacist for more information. Do not breast-feed an infant while taking this medicine.  What side effects may I notice from receiving this medicine?  Side effects that you should report to your doctor or health care professional as soon as possible:  -allergic  reactions like skin rash, itching or hives, swelling of the face, lips, or tongue  -signs of infection - fever or chills, cough, sore throat, pain or difficulty passing urine  -signs of decreased platelets or bleeding - bruising, pinpoint red spots on the skin, black, tarry stools, nosebleeds  -signs of decreased red blood cells - unusually weak or tired, fainting spells, lightheadedness  -breathing problems  -changes in hearing  -changes in vision  -chest pain  -high blood pressure  -low blood counts - This drug may decrease the number of white blood cells, red blood cells and platelets. You may be at increased risk for   hospital or clinic and will not be stored at home. NOTE: This sheet is a summary. It may not cover all possible information. If you have questions about this medicine, talk to your doctor, pharmacist, or health care provider.    2016, Elsevier/Gold Standard. (2007-07-20 14:38:05) Denosumab injection What is this medicine? DENOSUMAB (den oh sue mab) slows bone breakdown. Prolia is used to treat osteoporosis in women after menopause and in men. Delton See is used to prevent bone fractures and other bone problems caused by cancer bone metastases. Delton See is also used to treat giant cell tumor of the  bone. This medicine may be used for other purposes; ask your health care provider or pharmacist if you have questions. What should I tell my health care provider before I take this medicine? They need to know if you have any of these conditions: -dental disease -eczema -infection or history of infections -kidney disease or on dialysis -low blood calcium or vitamin D -malabsorption syndrome -scheduled to have surgery or tooth extraction -taking medicine that contains denosumab -thyroid or parathyroid disease -an unusual reaction to denosumab, other medicines, foods, dyes, or preservatives -pregnant or trying to get pregnant -breast-feeding How should I use this medicine? This medicine is for injection under the skin. It is given by a health care professional in a hospital or clinic setting. If you are getting Prolia, a special MedGuide will be given to you by the pharmacist with each prescription and refill. Be sure to read this information carefully each time. For Prolia, talk to your pediatrician regarding the use of this medicine in children. Special care may be needed. For Delton See, talk to your pediatrician regarding the use of this medicine in children. While this drug may be prescribed for children as young as 13 years for selected conditions, precautions do apply. Overdosage: If you think you have taken too much of this medicine contact a poison control center or emergency room at once. NOTE: This medicine is only for you. Do not share this medicine with others. What if I miss a dose? It is important not to miss your dose. Call your doctor or health care professional if you are unable to keep an appointment. What may interact with this medicine? Do not take this medicine with any of the following medications: -other medicines containing denosumab This medicine may also interact with the following medications: -medicines that suppress the immune system -medicines that treat  cancer -steroid medicines like prednisone or cortisone This list may not describe all possible interactions. Give your health care provider a list of all the medicines, herbs, non-prescription drugs, or dietary supplements you use. Also tell them if you smoke, drink alcohol, or use illegal drugs. Some items may interact with your medicine. What should I watch for while using this medicine? Visit your doctor or health care professional for regular checks on your progress. Your doctor or health care professional may order blood tests and other tests to see how you are doing. Call your doctor or health care professional if you get a cold or other infection while receiving this medicine. Do not treat yourself. This medicine may decrease your body's ability to fight infection. You should make sure you get enough calcium and vitamin D while you are taking this medicine, unless your doctor tells you not to. Discuss the foods you eat and the vitamins you take with your health care professional. See your dentist regularly. Brush and floss your teeth as directed. Before you have any dental work done,  tell your dentist you are receiving this medicine. Do not become pregnant while taking this medicine or for 5 months after stopping it. Women should inform their doctor if they wish to become pregnant or think they might be pregnant. There is a potential for serious side effects to an unborn child. Talk to your health care professional or pharmacist for more information. What side effects may I notice from receiving this medicine? Side effects that you should report to your doctor or health care professional as soon as possible: -allergic reactions like skin rash, itching or hives, swelling of the face, lips, or tongue -breathing problems -chest pain -fast, irregular heartbeat -feeling faint or lightheaded, falls -fever, chills, or any other sign of infection -muscle spasms, tightening, or twitches -numbness or  tingling -skin blisters or bumps, or is dry, peels, or red -slow healing or unexplained pain in the mouth or jaw -unusual bleeding or bruising Side effects that usually do not require medical attention (Report these to your doctor or health care professional if they continue or are bothersome.): -muscle pain -stomach upset, gas This list may not describe all possible side effects. Call your doctor for medical advice about side effects. You may report side effects to FDA at 1-800-FDA-1088. Where should I keep my medicine? This medicine is only given in a clinic, doctor's office, or other health care setting and will not be stored at home. NOTE: This sheet is a summary. It may not cover all possible information. If you have questions about this medicine, talk to your doctor, pharmacist, or health care provider.    2016, Elsevier/Gold Standard. (2011-10-13 12:37:47)

## 2015-09-17 NOTE — Telephone Encounter (Signed)
Erin with [facility] called for a verbal order to move physical therapy visit until June.  VP#[phone]/[id number]

## 2015-09-18 ENCOUNTER — Encounter: Payer: Medicare Other | Admitting: Family Medicine

## 2015-09-18 ENCOUNTER — Other Ambulatory Visit: Payer: Medicare Other

## 2015-09-18 ENCOUNTER — Encounter: Payer: Self-pay | Admitting: Hematology and Oncology

## 2015-09-18 DIAGNOSIS — J440 Chronic obstructive pulmonary disease with acute lower respiratory infection: Secondary | ICD-10-CM | POA: Diagnosis not present

## 2015-09-18 DIAGNOSIS — I10 Essential (primary) hypertension: Secondary | ICD-10-CM

## 2015-09-18 DIAGNOSIS — J45909 Unspecified asthma, uncomplicated: Secondary | ICD-10-CM | POA: Diagnosis not present

## 2015-09-18 DIAGNOSIS — J189 Pneumonia, unspecified organism: Secondary | ICD-10-CM | POA: Diagnosis not present

## 2015-09-18 MED ORDER — ONDANSETRON HCL 8 MG PO TABS: 8.0000 mg | ORAL_TABLET | Freq: Two times a day (BID) | ORAL | Status: DC | PRN

## 2015-09-18 NOTE — Telephone Encounter (Signed)
That's fine

## 2015-09-18 NOTE — Telephone Encounter (Signed)
Need more info. Why do they want to delay PT.

## 2015-09-18 NOTE — Telephone Encounter (Signed)
Junie Panning stated that patient's family requested that PT be put on hold for now due to recent diagnosis of lung cancer. Patient's family told therapist that pt has to many appts right now so they to put PT on hold. Junie Panning stated that she didn't want to cancel PT orders, but just put it on hold until about 2nd or 3rd week in June.

## 2015-09-18 NOTE — Telephone Encounter (Signed)
Bruce May was notified.

## 2015-09-19 ENCOUNTER — Telehealth: Payer: Self-pay | Admitting: *Deleted

## 2015-09-19 NOTE — Telephone Encounter (Signed)
Spoke with wife to offer navigation assistance if needed. Contact information given.

## 2015-09-19 NOTE — Telephone Encounter (Signed)
Called and spoke to daughter Jenny Reichmann and asked if when pt comes to get chemo education can he come early and get labs.  She is agreeable to having her dad come in 8:45.  Also the pt needs plain xrays that can been done after he gets MRI tom.  She asked if he can eat and drink for mri and the answer is yes. I told her that when they go check in for the MRI at the [facility]. To tell the staff that he needs plain xrays while they are there today

## 2015-09-20 ENCOUNTER — Ambulatory Visit
Admission: RE | Admit: 2015-09-20 | Discharge: 2015-09-20 | Disposition: A | Discharge status: Home / Self Care | Payer: Medicare Other | Source: Ambulatory Visit | Attending: Hematology and Oncology | Admitting: Hematology and Oncology

## 2015-09-20 ENCOUNTER — Telehealth: Payer: Self-pay | Admitting: Hematology and Oncology

## 2015-09-20 ENCOUNTER — Other Ambulatory Visit: Payer: Self-pay | Admitting: Vascular Surgery

## 2015-09-20 ENCOUNTER — Telehealth: Payer: Self-pay | Admitting: *Deleted

## 2015-09-20 ENCOUNTER — Inpatient Hospital Stay: Payer: Medicare Other

## 2015-09-20 ENCOUNTER — Other Ambulatory Visit: Payer: Self-pay

## 2015-09-20 ENCOUNTER — Other Ambulatory Visit: Payer: Self-pay | Admitting: *Deleted

## 2015-09-20 DIAGNOSIS — C3492 Malignant neoplasm of unspecified part of left bronchus or lung: Secondary | ICD-10-CM | POA: Diagnosis present

## 2015-09-20 DIAGNOSIS — Z5111 Encounter for antineoplastic chemotherapy: Secondary | ICD-10-CM | POA: Diagnosis not present

## 2015-09-20 DIAGNOSIS — C7951 Secondary malignant neoplasm of bone: Secondary | ICD-10-CM | POA: Insufficient documentation

## 2015-09-20 DIAGNOSIS — C787 Secondary malignant neoplasm of liver and intrahepatic bile duct: Secondary | ICD-10-CM | POA: Diagnosis present

## 2015-09-20 LAB — CBC WITH DIFFERENTIAL/PLATELET
Basophils Absolute: 0.1 10*3/uL (ref 0–0.1)
Basophils Relative: 1 %
Eosinophils Absolute: 0.2 10*3/uL (ref 0–0.7)
Eosinophils Relative: 2 %
HCT: 31.9 % — ABNORMAL LOW (ref 40.0–52.0)
Hemoglobin: 11.3 g/dL — ABNORMAL LOW (ref 13.0–18.0)
Lymphocytes Relative: 24 %
Lymphs Abs: 1.9 10*3/uL (ref 1.0–3.6)
MCH: 31 pg (ref 26.0–34.0)
MCHC: 35.3 g/dL (ref 32.0–36.0)
MCV: 87.9 fL (ref 80.0–100.0)
Monocytes Absolute: 0.8 10*3/uL (ref 0.2–1.0)
Monocytes Relative: 10 %
Neutro Abs: 5.1 10*3/uL (ref 1.4–6.5)
Neutrophils Relative %: 63 %
Platelets: 585 10*3/uL — ABNORMAL HIGH (ref 150–440)
RBC: 3.63 MIL/uL — ABNORMAL LOW (ref 4.40–5.90)
RDW: 14.8 % — ABNORMAL HIGH (ref 11.5–14.5)
WBC: 8.1 10*3/uL (ref 3.8–10.6)

## 2015-09-20 LAB — BASIC METABOLIC PANEL
Anion gap: 7 (ref 5–15)
BUN: 18 mg/dL (ref 6–20)
CO2: 24 mmol/L (ref 22–32)
Calcium: 8.8 mg/dL — ABNORMAL LOW (ref 8.9–10.3)
Chloride: 100 mmol/L — ABNORMAL LOW (ref 101–111)
Creatinine, Ser: 1.1 mg/dL (ref 0.61–1.24)
GFR calc Af Amer: >60 mL/min (ref >60)
GFR calc non Af Amer: >60 mL/min (ref >60)
Glucose, Bld: 144 mg/dL — ABNORMAL HIGH (ref 65–99)
Potassium: 4.3 mmol/L (ref 3.5–5.1)
Sodium: 131 mmol/L — ABNORMAL LOW (ref 135–145)

## 2015-09-20 LAB — PROTIME-INR
INR: 1.06
Prothrombin Time: 14 seconds (ref 11.4–15.0)

## 2015-09-20 LAB — LACTATE DEHYDROGENASE: LDH: 324 U/L — ABNORMAL HIGH (ref 98–192)

## 2015-09-20 LAB — APTT: aPTT: 34 seconds (ref 24–36)

## 2015-09-20 MED ORDER — LIDOCAINE-PRILOCAINE 2.5-2.5 % EX CREA: 1.0000 "application " | TOPICAL_CREAM | CUTANEOUS | Status: AC | PRN

## 2015-09-20 MED ORDER — GADOBENATE DIMEGLUMINE 529 MG/ML IV SOLN
15.0000 mL | Freq: Once | INTRAVENOUS | Status: AC | PRN
  Administered 2015-09-20: 13 mL via INTRAVENOUS

## 2015-09-20 MED ORDER — HYDROCODONE-ACETAMINOPHEN 5-325 MG PO TABS: 1.0000 | ORAL_TABLET | Freq: Four times a day (QID) | ORAL | Status: AC | PRN

## 2015-09-20 NOTE — Telephone Encounter (Signed)
Per request of [facility] to send levels to [facility] pt's na level and route the labs with in-basket to the above doctors

## 2015-09-20 NOTE — Telephone Encounter (Signed)
-----   Message from Lequita Asal, MD sent at 09/20/2015  1:56 PM EDT ----- Regarding: Please call and forward to nephrology  Patient with hyponatremia.  Na dropping.  M  ----- Message -----    From: Lab In [facility] Interface    Sent: 09/20/2015   8:52 AM      To: Lequita Asal, MD

## 2015-09-20 NOTE — Telephone Encounter (Signed)
Mike Gip called and spoke to son about the results of brain scan and he will talk to his parents with results

## 2015-09-20 NOTE — Telephone Encounter (Signed)
Re:  Head MRI  Head MRI reviewed with oldest son.  Three tiny CNS lesions.  Patient will see Dr Baruch Gouty in radiation oncology for consultation.  Suspect CNS radiation will be postponed for now as likely not causing an issue. Systemic therapy will be started next week.  Lequita Asal, MD

## 2015-09-20 NOTE — Telephone Encounter (Signed)
-----   Message from Lequita Asal, MD sent at 09/20/2015  4:00 PM EDT ----- Regarding: Brain mets  Let's notify the patient.  Let's consult Dr. Baruch Gouty.  We may postpone cranial XRT and start systemic therapy as his CNS disease is not that symptomatic.  M  ----- Message -----    From: Rad Results In Interface    Sent: 09/20/2015   2:15 PM      To: Lequita Asal, MD

## 2015-09-21 ENCOUNTER — Ambulatory Visit: Payer: Medicare Other

## 2015-09-21 LAB — CEA: CEA: 13.8 ng/mL — ABNORMAL HIGH (ref 0.0–4.7)

## 2015-09-23 ENCOUNTER — Encounter: Payer: Self-pay | Admitting: Emergency Medicine

## 2015-09-23 ENCOUNTER — Emergency Department
Admission: EM | Admit: 2015-09-23 | Discharge: 2015-09-23 | Disposition: A | Discharge status: Home / Self Care | Payer: Medicare Other | Attending: Emergency Medicine | Admitting: Emergency Medicine

## 2015-09-23 ENCOUNTER — Other Ambulatory Visit: Payer: Self-pay | Admitting: Hematology and Oncology

## 2015-09-23 ENCOUNTER — Emergency Department: Payer: Medicare Other

## 2015-09-23 DIAGNOSIS — I129 Hypertensive chronic kidney disease with stage 1 through stage 4 chronic kidney disease, or unspecified chronic kidney disease: Secondary | ICD-10-CM | POA: Diagnosis not present

## 2015-09-23 DIAGNOSIS — J449 Chronic obstructive pulmonary disease, unspecified: Secondary | ICD-10-CM | POA: Diagnosis not present

## 2015-09-23 DIAGNOSIS — M1990 Unspecified osteoarthritis, unspecified site: Secondary | ICD-10-CM | POA: Diagnosis not present

## 2015-09-23 DIAGNOSIS — S70259A Superficial foreign body, unspecified hip, initial encounter: Secondary | ICD-10-CM

## 2015-09-23 DIAGNOSIS — S71142A Puncture wound with foreign body, left thigh, initial encounter: Secondary | ICD-10-CM | POA: Insufficient documentation

## 2015-09-23 DIAGNOSIS — J45909 Unspecified asthma, uncomplicated: Secondary | ICD-10-CM | POA: Diagnosis not present

## 2015-09-23 DIAGNOSIS — C787 Secondary malignant neoplasm of liver and intrahepatic bile duct: Secondary | ICD-10-CM | POA: Diagnosis not present

## 2015-09-23 DIAGNOSIS — Z79899 Other long term (current) drug therapy: Secondary | ICD-10-CM | POA: Diagnosis not present

## 2015-09-23 DIAGNOSIS — E785 Hyperlipidemia, unspecified: Secondary | ICD-10-CM | POA: Insufficient documentation

## 2015-09-23 DIAGNOSIS — S71132A Puncture wound without foreign body, left thigh, initial encounter: Secondary | ICD-10-CM

## 2015-09-23 DIAGNOSIS — Z8639 Personal history of other endocrine, nutritional and metabolic disease: Secondary | ICD-10-CM | POA: Insufficient documentation

## 2015-09-23 DIAGNOSIS — Z8551 Personal history of malignant neoplasm of bladder: Secondary | ICD-10-CM | POA: Diagnosis not present

## 2015-09-23 DIAGNOSIS — C7951 Secondary malignant neoplasm of bone: Secondary | ICD-10-CM | POA: Insufficient documentation

## 2015-09-23 DIAGNOSIS — Y939 Activity, unspecified: Secondary | ICD-10-CM | POA: Diagnosis not present

## 2015-09-23 DIAGNOSIS — Y999 Unspecified external cause status: Secondary | ICD-10-CM | POA: Diagnosis not present

## 2015-09-23 DIAGNOSIS — W3400XA Accidental discharge from unspecified firearms or gun, initial encounter: Secondary | ICD-10-CM | POA: Insufficient documentation

## 2015-09-23 DIAGNOSIS — N183 Chronic kidney disease, stage 3 (moderate): Secondary | ICD-10-CM | POA: Insufficient documentation

## 2015-09-23 DIAGNOSIS — Z8673 Personal history of transient ischemic attack (TIA), and cerebral infarction without residual deficits: Secondary | ICD-10-CM | POA: Insufficient documentation

## 2015-09-23 DIAGNOSIS — Z23 Encounter for immunization: Secondary | ICD-10-CM | POA: Insufficient documentation

## 2015-09-23 DIAGNOSIS — F17210 Nicotine dependence, cigarettes, uncomplicated: Secondary | ICD-10-CM | POA: Diagnosis not present

## 2015-09-23 DIAGNOSIS — C3490 Malignant neoplasm of unspecified part of unspecified bronchus or lung: Secondary | ICD-10-CM | POA: Diagnosis not present

## 2015-09-23 DIAGNOSIS — Y929 Unspecified place or not applicable: Secondary | ICD-10-CM | POA: Insufficient documentation

## 2015-09-23 DIAGNOSIS — S70352A Superficial foreign body, left thigh, initial encounter: Secondary | ICD-10-CM

## 2015-09-23 MED ORDER — TETANUS-DIPHTH-ACELL PERTUSSIS 5-2.5-18.5 LF-MCG/0.5 IM SUSP
0.5000 mL | Freq: Once | INTRAMUSCULAR | Status: AC
  Administered 2015-09-23: 0.5 mL via INTRAMUSCULAR
  Filled 2015-09-23: qty 0.5

## 2015-09-23 MED ORDER — MORPHINE SULFATE (PF) 4 MG/ML IV SOLN
4.0000 mg | Freq: Once | INTRAVENOUS | Status: AC
  Administered 2015-09-23: 4 mg via INTRAVENOUS
  Filled 2015-09-23: qty 1

## 2015-09-23 MED ORDER — CEFAZOLIN SODIUM 1-5 GM-% IV SOLN
1.0000 g | Freq: Once | INTRAVENOUS | Status: AC
  Administered 2015-09-23: 1 g via INTRAVENOUS
  Filled 2015-09-23: qty 50

## 2015-09-23 MED ORDER — CEPHALEXIN 500 MG PO CAPS: 500.0000 mg | ORAL_CAPSULE | Freq: Once | ORAL | Status: DC

## 2015-09-23 MED ORDER — CEPHALEXIN 500 MG PO CAPS: 500.0000 mg | ORAL_CAPSULE | Freq: Two times a day (BID) | ORAL | Status: AC

## 2015-09-23 NOTE — ED Notes (Signed)
NAD noted at time of D/C. Pt denies questions or concerns. Pt taken to the lobby via wheelchair at this time.  

## 2015-09-23 NOTE — Discharge Instructions (Signed)
You were evaluated for puncture wound with a nail and foreign body embedded in the soft tissues of the left thigh.  Your tetanus shot was given today.  Return to the emergency department or any worsening condition including worsening pain, any numbness or tingling of the leg, blue discoloration to the leg, signs of infection such as fever, redness, or drainage.   Puncture Wound A puncture wound is an injury that is caused by a sharp, thin object that goes through your skin, such as a nail. A puncture wound usually does not leave a large opening in your skin, so it may not bleed a lot. However, when you get a puncture wound, dirt or other materials (foreign bodies) can be forced into your wound and break off inside. This makes it more likely that an infection will happen, such as tetanus. HOME CARE Medicines  Take or apply over-the-counter and prescription medicines only as told by your doctor.  If you were prescribed an antibiotic medicine, take or apply it as told by your doctor. Do not stop using the antibiotic even if your condition starts to get better. Wound Care  There are many ways to close and cover a wound. For example, a wound can be covered with stitches (sutures), skin glue, or adhesive strips. Follow instructions from your doctor about:  How to take care of your wound.  When and how you should change your bandage (dressing).  When you should remove your bandage.  Removing whatever was used to close your wound.  Keep the bandage dry as told by your doctor. Do not take baths, swim, use a hot tub, or do anything that would put your wound underwater until your doctor says it is okay.  Clean the wound as told by your doctor.  Do not scratch or pick at the wound.  Check your wound every day for signs of infection. Watch for:  Redness, swelling, or pain.  Fluid, blood, or pus. General Instructions  Raise (elevate) the injured area above the level of your heart while  you are sitting or lying down.  If your puncture wound is in your foot, ask your doctor if you need to avoid putting weight on your foot and for how long.  Keep all follow-up visits as told by your doctor. This is important. GET HELP IF:  You got a tetanus shot and you have any of these problems at the injection site:  Swelling.  Very bad pain.  Redness.  Bleeding.  You have a fever.  Your stitches come out.  You notice a bad smell coming from your wound or your bandage.  You notice something coming out of the wound, such as wood or glass.  Medicine does not help your pain.  You have more redness, swelling, or pain at the site of your wound.  You have fluid, blood, or pus coming from your wound.  You notice a change in the color of your skin near your wound.  You need to change the bandage often because fluid, blood, or pus is coming from the wound.  You start to have a new rash.  You start to have numbness around the wound. GET HELP RIGHT AWAY IF:  You have very bad swelling around the wound.  Your pain suddenly gets worse and is very bad.  You start to get painful skin lumps.  You have a red streak going away from your wound.  The wound is on your hand or foot and you cannot  move a finger or toe like you usually can.  The wound is on your hand or foot and you notice that your fingers or toes look pale or bluish.   This information is not intended to replace advice given to you by your health care provider. Make sure you discuss any questions you have with your health care provider.   Document Released: 01/22/2008 Document Revised: 01/03/2015 Document Reviewed: 06/07/2014 Elsevier Interactive Patient Education [employer].

## 2015-09-23 NOTE — ED Notes (Signed)
Patient presents to the ED after accidentally shooting his left upper leg with a nail gun, patient reports it's a thin carpenter nail.  Patient states he also has cancer.  Patient is in no obvious distress at this time.

## 2015-09-23 NOTE — ED Notes (Signed)
Unable to get E-sig due to pad not working, Social research officer, government and signed by patient's wife and placed on the chart.

## 2015-09-23 NOTE — ED Provider Notes (Signed)
[facility] Emergency Department Provider Note   ____________________________________________  Time seen: Approximately 5:20 PM I have reviewed the triage vital signs and the triage nursing note.  HISTORY  Chief Complaint Foreign Body   Historian Patient  HPI Bruce May is a 75 y.o. male who discharged a nail gun into his left medial thigh.  Moderate pain.  No numbness or tingling.  Nail completely in thigh.  No additional injuries.  Movement makes it worse.    Past Medical History  Diagnosis Date  . 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 Hyperthyroidism   . Nodular prostate with urinary retention   . TIA (transient ischemic attack)   . Acid reflux   . Asthma   . Hypertension   . 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 Temporary cerebral vascular dysfunction 07/29/2014  . Airway hyperreactivity 07/29/2014  . 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 COPD, mild ([place]) 10/11/2014    Patient Active Problem List   Diagnosis Date Noted  . 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 Liver metastases ([place]) 09/10/2015  . Bone metastasis ([place]) 09/07/2015  . Hyponatremia 08/30/2015  . Mechanical low back pain 08/03/2015  . Lumbar canal stenosis 08/03/2015  . Hypothyroidism 12/07/2014  . Weak pulse 10/11/2014  . Hypercholesterolemia without hypertriglyceridemia 10/11/2014  . Skin lesion 10/11/2014  . Adult hypothyroidism 10/11/2014  . H/O transient cerebral ischemia 10/11/2014  . H/O primary malignant neoplasm of urinary bladder 10/11/2014  . Fatigue 10/11/2014  . Blood pressure elevated 10/11/2014  . COPD, mild ([place]) 10/11/2014  . Benign fibroma of prostate 10/11/2014  . 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 Acid reflux 07/29/2014  . Arthritis 07/29/2014  . Airway hyperreactivity 07/29/2014  . HLD (hyperlipidemia) 07/29/2014  . Hyperthyroidism 07/29/2014  . Cancer of lateral wall of urinary  bladder ([place]) 07/29/2014  . Benign prostatic hyperplasia with urinary obstruction 07/29/2014  . Temporary cerebral vascular dysfunction 07/29/2014  . Chronic kidney disease (CKD), stage III (moderate) 12/14/2013  . Bladder retention 03/17/2012  . Bladder cancer ([place]) 03/17/2012  . Incomplete bladder emptying 03/17/2012  . Malignant neoplasm of urinary bladder ([place]) 03/17/2012  . 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 Bladder surgery  2012  . Back surgery      Current Outpatient Rx  Name  Route  Sig  Dispense  Refill  . albuterol (PROVENTIL HFA;VENTOLIN HFA) 108 (90 BASE) MCG/ACT inhaler   Inhalation   Inhale 2 puffs into the lungs 4 (four) times daily as needed.          . cephALEXin (KEFLEX) 500 MG capsule   Oral   Take 1 capsule (500 mg total) by mouth 2 (two) times daily.   14 capsule   0   . cetirizine (ZYRTEC) 10 MG tablet   Oral   Take 10 mg by mouth daily.         . ciprofloxacin (CIPRO) 500 MG tablet   Oral   Take 500 mg by mouth daily with breakfast.         . fenofibrate 160 MG tablet   Oral   Take 1 tablet (160 mg total) by mouth daily.   90 tablet   3   . finasteride (PROSCAR) 5 MG tablet   Oral   Take 1 tablet (5 mg total) by mouth daily.   [place]  tablet   3   . fluticasone furoate-vilanterol (BREO ELLIPTA) 200-25 MCG/INH AEPB   Inhalation   Inhale 2 puffs into the lungs daily.         Marland Kitchen HYDROcodone-acetaminophen (NORCO) 5-325 MG tablet   Oral   Take 1 tablet by mouth every 6 (six) hours as needed for moderate pain.   40 tablet   0   . levothyroxine (SYNTHROID, LEVOTHROID) 100 MCG tablet   Oral   Take 100 mcg by mouth daily before breakfast.         . lidocaine-prilocaine (EMLA) cream   Topical   Apply 1 application topically as needed. Apply about 1/2 tablespoon of medication and cover 1-2 hours before the use of your port   30 g   0   .  mirtazapine (REMERON) 7.5 MG tablet   Oral   Take 7.5 mg by mouth at bedtime.         . Multiple Vitamin (MULTIVITAMIN WITH MINERALS) TABS tablet   Oral   Take 1 tablet by mouth daily.         Marland Kitchen nystatin (MYCOSTATIN) 100000 UNIT/ML suspension   Oral   Take 5 mLs by mouth 4 (four) times daily. Reported on 09/07/2015         . Omega-3 Fatty Acids (FISH OIL EXTRA STRENGTH) 1200 MG CAPS   Oral   Take by mouth.         Marland Kitchen omeprazole (PRILOSEC) 40 MG capsule      TAKE ONE CAPSULE BY MOUTH ONCE DAILY AS NEEDED   90 capsule   0   . ondansetron (ZOFRAN) 8 MG tablet   Oral   Take 1 tablet (8 mg total) by mouth 2 (two) times daily as needed for refractory nausea / vomiting. Start on day 3 after carboplatin chemo.   30 tablet   1   . pravastatin (PRAVACHOL) 80 MG tablet      TAKE ONE TABLET BY MOUTH ONCE DAILY   90 tablet   3   . tiotropium (SPIRIVA) 18 MCG inhalation capsule   Inhalation   Place 18 mcg into inhaler and inhale 2 (two) times daily.            Allergies Review of patient's allergies indicates no known allergies.  Family History  Problem Relation Age of Onset  . Kidney disease Sister   . Diabetes Sister   . Bladder Cancer Neg Hx   . Prostate cancer Neg Hx     Social History Social History  Substance Use Topics  . Smoking status: Current Every Day Smoker -- 1.00 packs/day    Types: Cigarettes  . Smokeless tobacco: None  . Alcohol Use: No    Review of Systems  Constitutional: Eyes: No eye trauma ENT: No face trauma Cardiovascular: No chest trauma Respiratory: Negative for shortness of breath. Gastrointestinal: Negative for abdominal pain Genitourinary:  Musculoskeletal:  Skin:  Neurological: No numbness or tingling. 10 point Review of Systems otherwise negative ____________________________________________   PHYSICAL EXAM:  VITAL SIGNS: ED Triage Vitals  Enc Vitals Group     BP 09/23/15 1638 121/76 mmHg     Pulse Rate 09/23/15  1638 101     Resp 09/23/15 1638 18     Temp 09/23/15 1638 98.7 F (37.1 C)     Temp Source 09/23/15 1638 Oral     SpO2 09/23/15 1638 100 %     Weight 09/23/15 1638 141 lb (63.957 kg)     Height  09/23/15 1638 '5\' 9"'$  (1.753 m)     Head Cir --      Peak Flow --      Pain Score --      Pain Loc --      Pain Edu? --      Excl. in [place]? --      Constitutional: Alert and oriented. Well appearing and in no distress. HEENT   Head: Normocephalic and atraumatic.      Eyes: Conjunctivae are normal. PERRL. Normal extraocular movements.      Ears:         Nose: No congestion/rhinnorhea.   Mouth/Throat: Mucous membranes are moist.   Neck: No stridor. Cardiovascular/Chest: Normal rate, regular rhythm.  No murmurs, rubs, or gallops. Respiratory: Normal respiratory effort without tachypnea nor retractions. Breath sounds are clear and equal bilaterally. No wheezes/rales/rhonchi. Gastrointestinal: Soft. No distention, no guarding, no rebound. Nontender.    Genitourinary/rectal:Deferred Musculoskeletal: Tiny puncture mark left medial thigh, soreness to palpation. No major hematoma palpable soft compartments. Normal cap refill to the left foot. Normal dorsalis pedis pulse. Normal femoral pulse. Neurologic:  Normal speech and language. No gross or focal neurologic deficits are appreciated. Skin:  Skin is warm, dry and intact. No rash noted. Psychiatric: Mood and affect are normal. Speech and behavior are normal. Patient exhibits appropriate insight and judgment.  ____________________________________________   EKG I, Lisa Roca, MD, the attending physician have personally viewed and interpreted all ECGs.  None ____________________________________________  LABS (pertinent positives/negatives)  Labs Reviewed - No data to display  ____________________________________________  RADIOLOGY All Xrays were viewed by me. Imaging interpreted by Radiologist.  Left hip: Foreign body within the  medial left thigh __________________________________________  PROCEDURES  Procedure(s) performed: None  Critical Care performed: None  ____________________________________________   ED COURSE / ASSESSMENT AND PLAN  Pertinent labs & imaging results that were available during my care of the patient were reviewed by me and considered in my medical decision making (see chart for details).   No evidence of acute vascular injury clinically. No bony injury on x-ray. I discussed with Dr. Adonis Huguenin, general surgery, he does not recommend exploratory surgery to retreive.  Given no active bleeding, or evidence or neuro or vascular or bony injury, will treat with pain medications and leave for now.  Patient to follow up in clinic with Dr. Adonis Huguenin.  Patient was given pain control for symptoms.  Tetanus unknown, greater than 5 years he was given a booster here in the ED.  When I discussed with the patient, he got pretty irate that he wanted surgery now, and stated that "if he will take it out, the find someone that will."  I discussed with the family including the patient, wife and the son that it is essentially a risk versus benefit analysis, and that Dr. Adonis Huguenin will come down and discuss again with them the options are.  Patient to be discharged home, will follow up on Tuesday for possible nonemergency surgical removal.  CONSULTATIONS:   Dr. Adonis Huguenin, general surgery.   Patient / Family / Caregiver informed of clinical course, medical decision-making process, and agree with plan.   I discussed return precautions, follow-up instructions, and discharged instructions with patient and/or family.   ___________________________________________   FINAL CLINICAL IMPRESSION(S) / ED DIAGNOSES   Final diagnoses:  Foreign body of thigh, left, initial encounter  Puncture wound of left thigh, initial encounter              Note: This dictation was  prepared with [employer] . Any  transcriptional errors that result from this process are unintentional   Lisa Roca, MD 09/23/15 2056

## 2015-09-23 NOTE — Progress Notes (Signed)
  Asked by ER to evaluate patient and shot himself in the thigh with a nail gun.  No evidence of vascular compromise, no evidence neuro compromise, no evidence of damage to bone. No expanding hematoma or evidence of infection  Discussed with the patient that the surgical procedure to remove the nail would involve making a large incision and potentially causing damaged numerous structures in order to get to the nail to remove it. It is likely that the surgery to remove it would cause more symptoms than leaving it in place. Also discussed that the lack of any hard findings of damage to structures with mean that its removal would not be an emergency. Discussed that nonemergent surgeries would not occur prior to Tuesday.  After this discussion the patient is that he desired to go home. He would let us know whether not a cause of symptoms enough to want to have it surgically removed. He is already scheduled to have a port placed on Tuesday in this hospital. Discussed the hard signs of infection or vascular compromise and to return to emergency room immediately should they occur.  Clayburn Pert, MD FACS General Surgeon [facility]

## 2015-09-25 ENCOUNTER — Encounter
Admission: RE | Disposition: A | Discharge status: Home / Self Care | Payer: Self-pay | Source: Ambulatory Visit | Attending: Vascular Surgery

## 2015-09-25 ENCOUNTER — Encounter: Payer: Self-pay | Admitting: *Deleted

## 2015-09-25 ENCOUNTER — Ambulatory Visit
Admission: RE | Admit: 2015-09-25 | Discharge: 2015-09-25 | Disposition: A | Discharge status: Home / Self Care | Payer: Medicare Other | Source: Ambulatory Visit | Attending: Vascular Surgery | Admitting: Vascular Surgery

## 2015-09-25 DIAGNOSIS — E0590 Thyrotoxicosis, unspecified without thyrotoxic crisis or storm: Secondary | ICD-10-CM | POA: Insufficient documentation

## 2015-09-25 DIAGNOSIS — C679 Malignant neoplasm of bladder, unspecified: Secondary | ICD-10-CM | POA: Insufficient documentation

## 2015-09-25 DIAGNOSIS — R531 Weakness: Secondary | ICD-10-CM | POA: Insufficient documentation

## 2015-09-25 DIAGNOSIS — Z8673 Personal history of transient ischemic attack (TIA), and cerebral infarction without residual deficits: Secondary | ICD-10-CM | POA: Diagnosis not present

## 2015-09-25 DIAGNOSIS — J449 Chronic obstructive pulmonary disease, unspecified: Secondary | ICD-10-CM | POA: Diagnosis not present

## 2015-09-25 DIAGNOSIS — R5383 Other fatigue: Secondary | ICD-10-CM | POA: Diagnosis not present

## 2015-09-25 DIAGNOSIS — I10 Essential (primary) hypertension: Secondary | ICD-10-CM | POA: Diagnosis not present

## 2015-09-25 DIAGNOSIS — K219 Gastro-esophageal reflux disease without esophagitis: Secondary | ICD-10-CM | POA: Diagnosis not present

## 2015-09-25 DIAGNOSIS — N403 Nodular prostate with lower urinary tract symptoms: Secondary | ICD-10-CM | POA: Insufficient documentation

## 2015-09-25 HISTORY — PX: PERIPHERAL VASCULAR CATHETERIZATION: SHX172C

## 2015-09-25 HISTORY — DX: Neoplasm of unspecified behavior of bladder: D49.4

## 2015-09-25 SURGERY — PORTA CATH INSERTION
Anesthesia: Moderate Sedation

## 2015-09-25 MED ORDER — HEPARIN (PORCINE) IN NACL 2-0.9 UNIT/ML-% IJ SOLN
INTRAMUSCULAR | Status: AC
  Filled 2015-09-25: qty 500

## 2015-09-25 MED ORDER — LIDOCAINE-EPINEPHRINE (PF) 1 %-1:200000 IJ SOLN
INTRAMUSCULAR | Status: AC
  Filled 2015-09-25: qty 30

## 2015-09-25 MED ORDER — HYDROMORPHONE HCL 1 MG/ML IJ SOLN: 1.0000 mg | Freq: Once | INTRAMUSCULAR | Status: DC

## 2015-09-25 MED ORDER — MIDAZOLAM HCL 5 MG/5ML IJ SOLN
INTRAMUSCULAR | Status: AC
  Filled 2015-09-25: qty 5

## 2015-09-25 MED ORDER — SODIUM CHLORIDE 0.9 % IV SOLN
INTRAVENOUS | Status: DC
  Administered 2015-09-25: 14:00:00 via INTRAVENOUS

## 2015-09-25 MED ORDER — FENTANYL CITRATE (PF) 100 MCG/2ML IJ SOLN
INTRAMUSCULAR | Status: DC | PRN
  Administered 2015-09-25 (×2): 50 ug via INTRAVENOUS

## 2015-09-25 MED ORDER — SODIUM CHLORIDE 0.9 % IR SOLN
80.0000 mg | Freq: Once | Status: DC
  Filled 2015-09-25: qty 2

## 2015-09-25 MED ORDER — FENTANYL CITRATE (PF) 100 MCG/2ML IJ SOLN
INTRAMUSCULAR | Status: AC
  Filled 2015-09-25: qty 4

## 2015-09-25 MED ORDER — ONDANSETRON HCL 4 MG/2ML IJ SOLN: 4.0000 mg | Freq: Four times a day (QID) | INTRAMUSCULAR | Status: DC | PRN

## 2015-09-25 MED ORDER — MIDAZOLAM HCL 2 MG/2ML IJ SOLN
INTRAMUSCULAR | Status: DC | PRN
  Administered 2015-09-25: 1 mg via INTRAVENOUS
  Administered 2015-09-25: 2 mg via INTRAVENOUS

## 2015-09-25 MED ORDER — DEXTROSE 5 % IV SOLN: 1.5000 g | INTRAVENOUS | Status: DC

## 2015-09-25 SURGICAL SUPPLY — 10 items
BAG DECANTER STRL (MISCELLANEOUS) ×2 IMPLANT
KIT PORT POWER 8FR ISP CVUE (Catheter) ×2 IMPLANT
PACK ANGIOGRAPHY (CUSTOM PROCEDURE TRAY) ×2 IMPLANT
PAD GROUND ADULT SPLIT (MISCELLANEOUS) ×2 IMPLANT
PENCIL ELECTRO HAND CTR (MISCELLANEOUS) ×2 IMPLANT
PREP CHG 10.5 TEAL (MISCELLANEOUS) ×2 IMPLANT
SUT MNCRL AB 4-0 PS2 18 (SUTURE) ×2 IMPLANT
SUT PROLENE 0 CT 1 30 (SUTURE) ×2 IMPLANT
SUTURE VIC 3-0 (SUTURE) ×2 IMPLANT
TOWEL OR 17X26 4PK STRL BLUE (TOWEL DISPOSABLE) ×2 IMPLANT

## 2015-09-25 NOTE — OR Nursing (Signed)
Wife said his bp is accurate only in right arm due to blockage in left upper arm

## 2015-09-25 NOTE — H&P (Signed)
  [facility] History & Physical Update  The patient was interviewed and re-examined.  The patient's previous History and Physical has been reviewed and is unchanged.  There is no change in the plan of care. We plan to proceed with the scheduled procedure.  DEW,JASON, MD  09/25/2015, 3:01 PM

## 2015-09-25 NOTE — Op Note (Signed)
      [facility]       Operative Note  Date: 09/25/2015  Preoperative diagnosis:  1. Bladder cancer  Postoperative diagnosis:  Same as above  Procedures: #1. Ultrasound guidance for vascular access to the right internal jugular vein. #2. Fluoroscopic guidance for placement of catheter. #3. Placement of CT compatible Port-A-Cath, right internal jugular vein.  Surgeon: Leotis Pain, MD.   Anesthesia: Local with moderate conscious sedation for approximately 20  minutes using 3 mg of Versed and 50 mcg of Fentanyl  Fluoroscopy time: less than 1 minute  Contrast used: 0  Estimated blood loss: 20 cc  Indication for the procedure:  The patient is a 75 y.o.male with bladder cancer.  The patient needs a Port-A-Cath for durable venous access, chemotherapy, lab draws, and CT scans. We are asked to place this. Risks and benefits were discussed and informed consent was obtained.  Description of procedure: The patient was brought to the vascular and interventional radiology suite.  Moderate conscious sedation was administered throughout the procedure during a face to face encounter with the patient with my supervision of the RN administering medicines and monitoring the patient's vital signs, pulse oximetry, telemetry and mental status throughout from the start of the procedure until the patient was taken to the recovery room. The right neck chest and shoulder were sterilely prepped and draped, and a sterile surgical field was created. Ultrasound was used to help visualize a patent right internal jugular vein. This was then accessed under direct ultrasound guidance without difficulty with the Seldinger needle and a permanent image was recorded. A J-wire was placed. After skin nick and dilatation, the peel-away sheath was then placed over the wire. I then anesthetized an area under the clavicle approximately 1-2 fingerbreadths. A transverse incision was created and an inferior pocket  was created with electrocautery and blunt dissection. The port was then brought onto the field, placed into the pocket and secured to the chest wall with 2 Prolene sutures. The catheter was connected to the port and tunneled from the subclavicular incision to the access site. Fluoroscopic guidance was then used to cut the catheter to an appropriate length. The catheter was then placed through the peel-away sheath and the peel-away sheath was removed. The catheter tip was parked in excellent location under fluorocoscopic guidance in the cavoatrial junction area. The pocket was then irrigated with antibiotic impregnated saline and the wound was closed with a running 3-0 Vicryl and a 4-0 Monocryl. The access incision was closed with a single 4-0 Monocryl. The Huber needle was used to withdraw blood and flush the port with heparinized saline. Dermabond was then placed as a dressing. The patient tolerated the procedure well and was taken to the recovery room in stable condition.   Bruce May 09/25/2015 5:11 PM

## 2015-09-26 ENCOUNTER — Inpatient Hospital Stay: Payer: Medicare Other

## 2015-09-26 ENCOUNTER — Other Ambulatory Visit: Payer: Self-pay | Admitting: Hematology and Oncology

## 2015-09-26 ENCOUNTER — Encounter: Payer: Self-pay | Admitting: Vascular Surgery

## 2015-09-26 ENCOUNTER — Inpatient Hospital Stay (HOSPITAL_BASED_OUTPATIENT_CLINIC_OR_DEPARTMENT_OTHER): Payer: Medicare Other | Admitting: Oncology

## 2015-09-26 VITALS — BP 152/77 | HR 84 | Temp 97.8°F | Resp 18 | Wt 144.0 lb

## 2015-09-26 DIAGNOSIS — R634 Abnormal weight loss: Secondary | ICD-10-CM

## 2015-09-26 DIAGNOSIS — Z8673 Personal history of transient ischemic attack (TIA), and cerebral infarction without residual deficits: Secondary | ICD-10-CM

## 2015-09-26 DIAGNOSIS — Z5111 Encounter for antineoplastic chemotherapy: Secondary | ICD-10-CM | POA: Diagnosis not present

## 2015-09-26 DIAGNOSIS — R5383 Other fatigue: Secondary | ICD-10-CM | POA: Diagnosis not present

## 2015-09-26 DIAGNOSIS — C3492 Malignant neoplasm of unspecified part of left bronchus or lung: Secondary | ICD-10-CM

## 2015-09-26 DIAGNOSIS — J439 Emphysema, unspecified: Secondary | ICD-10-CM

## 2015-09-26 DIAGNOSIS — N400 Enlarged prostate without lower urinary tract symptoms: Secondary | ICD-10-CM

## 2015-09-26 DIAGNOSIS — J449 Chronic obstructive pulmonary disease, unspecified: Secondary | ICD-10-CM

## 2015-09-26 DIAGNOSIS — K219 Gastro-esophageal reflux disease without esophagitis: Secondary | ICD-10-CM

## 2015-09-26 DIAGNOSIS — I2510 Atherosclerotic heart disease of native coronary artery without angina pectoris: Secondary | ICD-10-CM

## 2015-09-26 DIAGNOSIS — Z66 Do not resuscitate: Secondary | ICD-10-CM

## 2015-09-26 DIAGNOSIS — Z8551 Personal history of malignant neoplasm of bladder: Secondary | ICD-10-CM

## 2015-09-26 DIAGNOSIS — Z79899 Other long term (current) drug therapy: Secondary | ICD-10-CM

## 2015-09-26 DIAGNOSIS — Z8701 Personal history of pneumonia (recurrent): Secondary | ICD-10-CM

## 2015-09-26 DIAGNOSIS — C3412 Malignant neoplasm of upper lobe, left bronchus or lung: Secondary | ICD-10-CM | POA: Diagnosis not present

## 2015-09-26 DIAGNOSIS — R531 Weakness: Secondary | ICD-10-CM | POA: Diagnosis not present

## 2015-09-26 DIAGNOSIS — C7951 Secondary malignant neoplasm of bone: Secondary | ICD-10-CM

## 2015-09-26 DIAGNOSIS — E0590 Thyrotoxicosis, unspecified without thyrotoxic crisis or storm: Secondary | ICD-10-CM

## 2015-09-26 DIAGNOSIS — F17210 Nicotine dependence, cigarettes, uncomplicated: Secondary | ICD-10-CM | POA: Diagnosis not present

## 2015-09-26 LAB — CBC WITH DIFFERENTIAL/PLATELET
Basophils Absolute: 0.1 10*3/uL (ref 0–0.1)
Basophils Relative: 1 %
Eosinophils Absolute: 0.1 10*3/uL (ref 0–0.7)
Eosinophils Relative: 1 %
HCT: 27 % — ABNORMAL LOW (ref 40.0–52.0)
Hemoglobin: 9.3 g/dL — ABNORMAL LOW (ref 13.0–18.0)
Lymphocytes Relative: 20 %
Lymphs Abs: 1.7 10*3/uL (ref 1.0–3.6)
MCH: 30.4 pg (ref 26.0–34.0)
MCHC: 34.5 g/dL (ref 32.0–36.0)
MCV: 88.2 fL (ref 80.0–100.0)
Monocytes Absolute: 0.8 10*3/uL (ref 0.2–1.0)
Monocytes Relative: 10 %
Neutro Abs: 5.8 10*3/uL (ref 1.4–6.5)
Neutrophils Relative %: 68 %
Platelets: 379 10*3/uL (ref 150–440)
RBC: 3.06 MIL/uL — ABNORMAL LOW (ref 4.40–5.90)
RDW: 15 % — ABNORMAL HIGH (ref 11.5–14.5)
WBC: 8.4 10*3/uL (ref 3.8–10.6)

## 2015-09-26 LAB — COMPREHENSIVE METABOLIC PANEL
ALT: 16 U/L — ABNORMAL LOW (ref 17–63)
AST: 45 U/L — ABNORMAL HIGH (ref 15–41)
Albumin: 2.9 g/dL — ABNORMAL LOW (ref 3.5–5.0)
Alkaline Phosphatase: 98 U/L (ref 38–126)
Anion gap: 7 (ref 5–15)
BUN: 23 mg/dL — ABNORMAL HIGH (ref 6–20)
CO2: 25 mmol/L (ref 22–32)
Calcium: 8.4 mg/dL — ABNORMAL LOW (ref 8.9–10.3)
Chloride: 103 mmol/L (ref 101–111)
Creatinine, Ser: 0.9 mg/dL (ref 0.61–1.24)
GFR calc Af Amer: >60 mL/min (ref >60)
GFR calc non Af Amer: >60 mL/min (ref >60)
Glucose, Bld: 219 mg/dL — ABNORMAL HIGH (ref 65–99)
Potassium: 3.7 mmol/L (ref 3.5–5.1)
Sodium: 135 mmol/L (ref 135–145)
Total Bilirubin: 0.4 mg/dL (ref 0.3–1.2)
Total Protein: 6.9 g/dL (ref 6.5–8.1)

## 2015-09-26 LAB — LACTATE DEHYDROGENASE: LDH: 390 U/L — ABNORMAL HIGH (ref 98–192)

## 2015-09-26 MED ORDER — SODIUM CHLORIDE 0.9 % IV SOLN
Freq: Once | INTRAVENOUS | Status: AC
  Administered 2015-09-26: 11:00:00 via INTRAVENOUS
  Filled 2015-09-26: qty 1000

## 2015-09-26 MED ORDER — DENOSUMAB 120 MG/1.7ML ~~LOC~~ SOLN
120.0000 mg | Freq: Once | SUBCUTANEOUS | Status: AC
  Administered 2015-09-26: 120 mg via SUBCUTANEOUS
  Filled 2015-09-26: qty 1.7

## 2015-09-26 MED ORDER — SODIUM CHLORIDE 0.9 % IV SOLN
10.0000 mg | Freq: Once | INTRAVENOUS | Status: AC
  Administered 2015-09-26: 10 mg via INTRAVENOUS
  Filled 2015-09-26: qty 1

## 2015-09-26 MED ORDER — SODIUM CHLORIDE 0.9% FLUSH
10.0000 mL | INTRAVENOUS | Status: DC | PRN
  Administered 2015-09-26: 10 mL via INTRAVENOUS
  Filled 2015-09-26: qty 10

## 2015-09-26 MED ORDER — HEPARIN SOD (PORK) LOCK FLUSH 100 UNIT/ML IV SOLN
500.0000 [IU] | Freq: Once | INTRAVENOUS | Status: AC
  Administered 2015-09-26: 500 [IU] via INTRAVENOUS

## 2015-09-26 MED ORDER — SODIUM CHLORIDE 0.9 % IV SOLN
413.5000 mg | Freq: Once | INTRAVENOUS | Status: AC
  Administered 2015-09-26: 410 mg via INTRAVENOUS
  Filled 2015-09-26: qty 41

## 2015-09-26 MED ORDER — ETOPOSIDE CHEMO INJECTION 1 GM/50ML
80.0000 mg/m2 | Freq: Once | INTRAVENOUS | Status: AC
  Administered 2015-09-26: 140 mg via INTRAVENOUS
  Filled 2015-09-26: qty 7

## 2015-09-26 MED ORDER — PALONOSETRON HCL INJECTION 0.25 MG/5ML
0.2500 mg | Freq: Once | INTRAVENOUS | Status: AC
  Administered 2015-09-26: 0.25 mg via INTRAVENOUS
  Filled 2015-09-26: qty 5

## 2015-09-26 NOTE — Progress Notes (Signed)
Patient had his port placed yesterday afternoon.  Does have concerns of feeling weak and sleeping a lot.

## 2015-09-27 ENCOUNTER — Inpatient Hospital Stay: Payer: Medicare Other | Attending: Plastic Surgery

## 2015-09-27 VITALS — BP 164/80 | HR 87 | Resp 18

## 2015-09-27 DIAGNOSIS — C3412 Malignant neoplasm of upper lobe, left bronchus or lung: Secondary | ICD-10-CM | POA: Insufficient documentation

## 2015-09-27 DIAGNOSIS — C3492 Malignant neoplasm of unspecified part of left bronchus or lung: Secondary | ICD-10-CM

## 2015-09-27 DIAGNOSIS — Z7689 Persons encountering health services in other specified circumstances: Secondary | ICD-10-CM | POA: Insufficient documentation

## 2015-09-27 DIAGNOSIS — Z5111 Encounter for antineoplastic chemotherapy: Secondary | ICD-10-CM | POA: Insufficient documentation

## 2015-09-27 LAB — CEA: CEA: 15.4 ng/mL — ABNORMAL HIGH (ref 0.0–4.7)

## 2015-09-27 MED ORDER — SODIUM CHLORIDE 0.9 % IV SOLN
80.0000 mg/m2 | Freq: Once | INTRAVENOUS | Status: AC
  Administered 2015-09-27: 140 mg via INTRAVENOUS
  Filled 2015-09-27: qty 7

## 2015-09-27 MED ORDER — SODIUM CHLORIDE 0.9 % IV SOLN
Freq: Once | INTRAVENOUS | Status: AC
  Administered 2015-09-27: 13:00:00 via INTRAVENOUS
  Filled 2015-09-27: qty 1000

## 2015-09-27 MED ORDER — HEPARIN SOD (PORK) LOCK FLUSH 100 UNIT/ML IV SOLN
500.0000 [IU] | Freq: Once | INTRAVENOUS | Status: AC
  Administered 2015-09-27: 500 [IU] via INTRAVENOUS

## 2015-09-27 MED ORDER — SODIUM CHLORIDE 0.9% FLUSH
10.0000 mL | INTRAVENOUS | Status: DC | PRN
  Filled 2015-09-27: qty 10

## 2015-09-27 MED ORDER — SODIUM CHLORIDE 0.9 % IV SOLN
10.0000 mg | Freq: Once | INTRAVENOUS | Status: AC
  Administered 2015-09-27: 10 mg via INTRAVENOUS
  Filled 2015-09-27: qty 1

## 2015-09-27 MED ORDER — HEPARIN SOD (PORK) LOCK FLUSH 100 UNIT/ML IV SOLN
INTRAVENOUS | Status: AC
  Filled 2015-09-27: qty 5

## 2015-09-28 ENCOUNTER — Inpatient Hospital Stay: Payer: Medicare Other

## 2015-09-28 ENCOUNTER — Telehealth: Payer: Self-pay | Admitting: Family Medicine

## 2015-09-28 VITALS — BP 165/69 | HR 78 | Temp 97.0°F | Resp 18

## 2015-09-28 DIAGNOSIS — C3492 Malignant neoplasm of unspecified part of left bronchus or lung: Secondary | ICD-10-CM

## 2015-09-28 MED ORDER — HEPARIN SOD (PORK) LOCK FLUSH 100 UNIT/ML IV SOLN
500.0000 [IU] | Freq: Once | INTRAVENOUS | Status: AC
  Administered 2015-09-28: 500 [IU] via INTRAVENOUS

## 2015-09-28 MED ORDER — SODIUM CHLORIDE 0.9 % IV SOLN
80.0000 mg/m2 | Freq: Once | INTRAVENOUS | Status: AC
  Administered 2015-09-28: 140 mg via INTRAVENOUS
  Filled 2015-09-28: qty 7

## 2015-09-28 MED ORDER — SODIUM CHLORIDE 0.9 % IV SOLN
Freq: Once | INTRAVENOUS | Status: AC
  Administered 2015-09-28: 14:00:00 via INTRAVENOUS
  Filled 2015-09-28: qty 1000

## 2015-09-28 MED ORDER — HEPARIN SOD (PORK) LOCK FLUSH 100 UNIT/ML IV SOLN
INTRAVENOUS | Status: AC
  Filled 2015-09-28: qty 5

## 2015-09-28 MED ORDER — PEGFILGRASTIM 6 MG/0.6ML ~~LOC~~ PSKT
6.0000 mg | PREFILLED_SYRINGE | Freq: Once | SUBCUTANEOUS | Status: AC
  Administered 2015-09-28: 6 mg via SUBCUTANEOUS
  Filled 2015-09-28: qty 0.6

## 2015-09-28 MED ORDER — SODIUM CHLORIDE 0.9 % IV SOLN
10.0000 mg | Freq: Once | INTRAVENOUS | Status: AC
  Administered 2015-09-28: 10 mg via INTRAVENOUS
  Filled 2015-09-28: qty 1

## 2015-09-28 MED ORDER — SODIUM CHLORIDE 0.9% FLUSH
10.0000 mL | INTRAVENOUS | Status: DC | PRN
  Filled 2015-09-28: qty 10

## 2015-09-28 NOTE — Telephone Encounter (Signed)
FYI/  Lauren with at [facility] said the family told him that they want to cancel home care services for right now while Mr. Bruce May is under going chemo.  He will update you if he starts back home care services or if they discharge him completely.  Thanks Con Memos

## 2015-09-30 ENCOUNTER — Inpatient Hospital Stay
Admission: EM | Admit: 2015-09-30 | Discharge: 2015-10-27 | DRG: 189 | Disposition: E | Payer: Medicare Other | Attending: Specialist | Admitting: Specialist

## 2015-09-30 ENCOUNTER — Emergency Department: Payer: Medicare Other

## 2015-09-30 ENCOUNTER — Other Ambulatory Visit: Payer: Self-pay

## 2015-09-30 ENCOUNTER — Encounter: Payer: Self-pay | Admitting: Emergency Medicine

## 2015-09-30 DIAGNOSIS — I2699 Other pulmonary embolism without acute cor pulmonale: Secondary | ICD-10-CM | POA: Diagnosis present

## 2015-09-30 DIAGNOSIS — J449 Chronic obstructive pulmonary disease, unspecified: Secondary | ICD-10-CM | POA: Diagnosis present

## 2015-09-30 DIAGNOSIS — C3412 Malignant neoplasm of upper lobe, left bronchus or lung: Secondary | ICD-10-CM | POA: Diagnosis present

## 2015-09-30 DIAGNOSIS — F17210 Nicotine dependence, cigarettes, uncomplicated: Secondary | ICD-10-CM | POA: Diagnosis present

## 2015-09-30 DIAGNOSIS — C787 Secondary malignant neoplasm of liver and intrahepatic bile duct: Secondary | ICD-10-CM | POA: Diagnosis present

## 2015-09-30 DIAGNOSIS — J9601 Acute respiratory failure with hypoxia: Principal | ICD-10-CM | POA: Diagnosis present

## 2015-09-30 DIAGNOSIS — Z8551 Personal history of malignant neoplasm of bladder: Secondary | ICD-10-CM | POA: Diagnosis not present

## 2015-09-30 DIAGNOSIS — Z9221 Personal history of antineoplastic chemotherapy: Secondary | ICD-10-CM | POA: Diagnosis not present

## 2015-09-30 DIAGNOSIS — J810 Acute pulmonary edema: Secondary | ICD-10-CM | POA: Diagnosis present

## 2015-09-30 DIAGNOSIS — R0603 Acute respiratory distress: Secondary | ICD-10-CM | POA: Diagnosis present

## 2015-09-30 DIAGNOSIS — C7951 Secondary malignant neoplasm of bone: Secondary | ICD-10-CM | POA: Diagnosis present

## 2015-09-30 DIAGNOSIS — Z841 Family history of disorders of kidney and ureter: Secondary | ICD-10-CM

## 2015-09-30 DIAGNOSIS — J45909 Unspecified asthma, uncomplicated: Secondary | ICD-10-CM | POA: Diagnosis present

## 2015-09-30 DIAGNOSIS — C7931 Secondary malignant neoplasm of brain: Secondary | ICD-10-CM | POA: Diagnosis present

## 2015-09-30 DIAGNOSIS — D72829 Elevated white blood cell count, unspecified: Secondary | ICD-10-CM | POA: Diagnosis present

## 2015-09-30 DIAGNOSIS — Z515 Encounter for palliative care: Secondary | ICD-10-CM | POA: Diagnosis present

## 2015-09-30 DIAGNOSIS — R0602 Shortness of breath: Secondary | ICD-10-CM | POA: Diagnosis present

## 2015-09-30 DIAGNOSIS — Z8673 Personal history of transient ischemic attack (TIA), and cerebral infarction without residual deficits: Secondary | ICD-10-CM

## 2015-09-30 DIAGNOSIS — Z66 Do not resuscitate: Secondary | ICD-10-CM | POA: Diagnosis present

## 2015-09-30 DIAGNOSIS — E876 Hypokalemia: Secondary | ICD-10-CM | POA: Diagnosis present

## 2015-09-30 DIAGNOSIS — I10 Essential (primary) hypertension: Secondary | ICD-10-CM | POA: Diagnosis present

## 2015-09-30 DIAGNOSIS — Y929 Unspecified place or not applicable: Secondary | ICD-10-CM | POA: Diagnosis not present

## 2015-09-30 DIAGNOSIS — Z833 Family history of diabetes mellitus: Secondary | ICD-10-CM

## 2015-09-30 DIAGNOSIS — T458X5A Adverse effect of other primarily systemic and hematological agents, initial encounter: Secondary | ICD-10-CM | POA: Diagnosis present

## 2015-09-30 DIAGNOSIS — R4189 Other symptoms and signs involving cognitive functions and awareness: Secondary | ICD-10-CM

## 2015-09-30 DIAGNOSIS — C3490 Malignant neoplasm of unspecified part of unspecified bronchus or lung: Secondary | ICD-10-CM

## 2015-09-30 LAB — CBC WITH DIFFERENTIAL/PLATELET
BASOS ABS: 0.1 10*3/uL (ref 0–0.1)
Eosinophils Absolute: 0.1 10*3/uL (ref 0–0.7)
Eosinophils Relative: 0 %
HEMATOCRIT: 26.8 % — AB (ref 40.0–52.0)
Hemoglobin: 8.9 g/dL — ABNORMAL LOW (ref 13.0–18.0)
Lymphocytes Relative: 9 %
Lymphs Abs: 1.7 10*3/uL (ref 1.0–3.6)
MCH: 29.4 pg (ref 26.0–34.0)
MCHC: 33.2 g/dL (ref 32.0–36.0)
MCV: 88.5 fL (ref 80.0–100.0)
MONO ABS: 0.1 10*3/uL — AB (ref 0.2–1.0)
Monocytes Relative: 1 %
NEUTROS ABS: 17.9 10*3/uL — AB (ref 1.4–6.5)
Platelets: 332 10*3/uL (ref 150–440)
RBC: 3.03 MIL/uL — AB (ref 4.40–5.90)
RDW: 15.3 % — AB (ref 11.5–14.5)
WBC: 19.9 10*3/uL — AB (ref 3.8–10.6)

## 2015-09-30 LAB — COMPREHENSIVE METABOLIC PANEL
ALK PHOS: 96 U/L (ref 38–126)
ALT: 18 U/L (ref 17–63)
AST: 42 U/L — AB (ref 15–41)
Albumin: 2.9 g/dL — ABNORMAL LOW (ref 3.5–5.0)
Anion gap: 9 (ref 5–15)
BUN: 36 mg/dL — AB (ref 6–20)
CALCIUM: 7.3 mg/dL — AB (ref 8.9–10.3)
CHLORIDE: 103 mmol/L (ref 101–111)
CO2: 26 mmol/L (ref 22–32)
CREATININE: 1.21 mg/dL (ref 0.61–1.24)
GFR calc Af Amer: >60 mL/min (ref >60)
GFR calc non Af Amer: 56 mL/min — ABNORMAL LOW (ref >60)
GLUCOSE: 123 mg/dL — AB (ref 65–99)
Potassium: 3.4 mmol/L — ABNORMAL LOW (ref 3.5–5.1)
SODIUM: 138 mmol/L (ref 135–145)
Total Bilirubin: 0.6 mg/dL (ref 0.3–1.2)
Total Protein: 6.3 g/dL — ABNORMAL LOW (ref 6.5–8.1)

## 2015-09-30 LAB — URINALYSIS COMPLETE WITH MICROSCOPIC (ARMC ONLY)
Bilirubin Urine: NEGATIVE
GLUCOSE, UA: NEGATIVE mg/dL
Hgb urine dipstick: NEGATIVE
Ketones, ur: NEGATIVE mg/dL
Leukocytes, UA: NEGATIVE
Nitrite: NEGATIVE
PROTEIN: 100 mg/dL — AB
SPECIFIC GRAVITY, URINE: 1.011 (ref 1.005–1.030)
SQUAMOUS EPITHELIAL / LPF: NONE SEEN
pH: 5 (ref 5.0–8.0)

## 2015-09-30 LAB — PROTIME-INR
INR: 1.23
PROTHROMBIN TIME: 15.7 s — AB (ref 11.4–15.0)

## 2015-09-30 LAB — LIPASE, BLOOD: LIPASE: 23 U/L (ref 11–51)

## 2015-09-30 LAB — TROPONIN I: Troponin I: 0.06 ng/mL — ABNORMAL HIGH (ref <0.031)

## 2015-09-30 LAB — APTT: APTT: 29 s (ref 24–36)

## 2015-09-30 LAB — LACTIC ACID, PLASMA: LACTIC ACID, VENOUS: 0.8 mmol/L (ref 0.5–2.0)

## 2015-09-30 MED ORDER — POLYVINYL ALCOHOL 1.4 % OP SOLN
1.0000 [drp] | Freq: Four times a day (QID) | OPHTHALMIC | Status: DC | PRN
  Filled 2015-09-30: qty 15

## 2015-09-30 MED ORDER — ACETAMINOPHEN 325 MG PO TABS: 650.0000 mg | ORAL_TABLET | Freq: Four times a day (QID) | ORAL | Status: DC | PRN

## 2015-09-30 MED ORDER — LORAZEPAM 2 MG/ML IJ SOLN
1.0000 mg | Freq: Once | INTRAMUSCULAR | Status: AC
  Administered 2015-09-30: 1 mg via INTRAVENOUS

## 2015-09-30 MED ORDER — ACETAMINOPHEN 650 MG RE SUPP: 650.0000 mg | Freq: Four times a day (QID) | RECTAL | Status: DC | PRN

## 2015-09-30 MED ORDER — KETAMINE HCL 10 MG/ML IJ SOLN: 30.0000 mg | Freq: Once | INTRAMUSCULAR | Status: DC

## 2015-09-30 MED ORDER — LORAZEPAM 2 MG/ML IJ SOLN
INTRAMUSCULAR | Status: AC
  Administered 2015-09-30: 1 mg via INTRAVENOUS
  Filled 2015-09-30: qty 1

## 2015-09-30 MED ORDER — IPRATROPIUM-ALBUTEROL 0.5-2.5 (3) MG/3ML IN SOLN
6.0000 mL | Freq: Once | RESPIRATORY_TRACT | Status: AC
  Administered 2015-09-30: 6 mL via RESPIRATORY_TRACT

## 2015-09-30 MED ORDER — GLYCOPYRROLATE 0.2 MG/ML IJ SOLN
0.2000 mg | INTRAMUSCULAR | Status: DC | PRN
  Filled 2015-09-30: qty 1

## 2015-09-30 MED ORDER — HALOPERIDOL 0.5 MG PO TABS
0.5000 mg | ORAL_TABLET | ORAL | Status: DC | PRN
  Filled 2015-09-30: qty 1

## 2015-09-30 MED ORDER — LORAZEPAM 2 MG/ML IJ SOLN
1.0000 mg | INTRAMUSCULAR | Status: DC | PRN
  Administered 2015-10-01: 1 mg via INTRAVENOUS
  Filled 2015-09-30: qty 1

## 2015-09-30 MED ORDER — ONDANSETRON 4 MG PO TBDP: 4.0000 mg | ORAL_TABLET | Freq: Four times a day (QID) | ORAL | Status: DC | PRN

## 2015-09-30 MED ORDER — HALOPERIDOL LACTATE 2 MG/ML PO CONC
0.5000 mg | ORAL | Status: DC | PRN
  Filled 2015-09-30: qty 0.3

## 2015-09-30 MED ORDER — GLYCOPYRROLATE 1 MG PO TABS
1.0000 mg | ORAL_TABLET | ORAL | Status: DC | PRN
  Filled 2015-09-30: qty 1

## 2015-09-30 MED ORDER — ONDANSETRON HCL 4 MG/2ML IJ SOLN
4.0000 mg | Freq: Four times a day (QID) | INTRAMUSCULAR | Status: DC | PRN
  Administered 2015-09-30: 4 mg via INTRAVENOUS
  Filled 2015-09-30: qty 2

## 2015-09-30 MED ORDER — CEFEPIME HCL 2 G IJ SOLR
2.0000 g | Freq: Once | INTRAMUSCULAR | Status: AC
  Administered 2015-09-30: 2 g via INTRAVENOUS
  Filled 2015-09-30: qty 2

## 2015-09-30 MED ORDER — SODIUM CHLORIDE 0.9 % IV BOLUS (SEPSIS)
1000.0000 mL | Freq: Once | INTRAVENOUS | Status: AC
  Administered 2015-09-30: 1000 mL via INTRAVENOUS

## 2015-09-30 MED ORDER — IPRATROPIUM-ALBUTEROL 0.5-2.5 (3) MG/3ML IN SOLN
RESPIRATORY_TRACT | Status: AC
  Administered 2015-09-30: 6 mL via RESPIRATORY_TRACT
  Filled 2015-09-30: qty 6

## 2015-09-30 MED ORDER — MORPHINE SULFATE (PF) 4 MG/ML IV SOLN
4.0000 mg | Freq: Once | INTRAVENOUS | Status: AC
  Administered 2015-09-30: 4 mg via INTRAVENOUS

## 2015-09-30 MED ORDER — MORPHINE SULFATE (PF) 4 MG/ML IV SOLN
INTRAVENOUS | Status: AC
  Administered 2015-09-30: 4 mg via INTRAVENOUS
  Filled 2015-09-30: qty 1

## 2015-09-30 MED ORDER — SODIUM CHLORIDE 0.9 % IV BOLUS (SEPSIS): 250.0000 mL | Freq: Once | INTRAVENOUS | Status: DC

## 2015-09-30 MED ORDER — VANCOMYCIN HCL IN DEXTROSE 1-5 GM/200ML-% IV SOLN
1000.0000 mg | Freq: Once | INTRAVENOUS | Status: AC
  Administered 2015-09-30: 1000 mg via INTRAVENOUS
  Filled 2015-09-30: qty 200

## 2015-09-30 MED ORDER — HALOPERIDOL LACTATE 5 MG/ML IJ SOLN
0.5000 mg | INTRAMUSCULAR | Status: DC | PRN
Start: 2015-09-30 — End: 2015-10-01

## 2015-09-30 MED ORDER — BIOTENE DRY MOUTH MT LIQD
15.0000 mL | OROMUCOSAL | Status: DC | PRN
  Filled 2015-09-30: qty 15

## 2015-09-30 MED ORDER — FUROSEMIDE 10 MG/ML IJ SOLN
40.0000 mg | INTRAMUSCULAR | Status: DC
Start: 2015-09-30 — End: 2015-10-01

## 2015-09-30 MED ORDER — GLYCOPYRROLATE 0.2 MG/ML IJ SOLN
0.2000 mg | INTRAMUSCULAR | Status: DC | PRN
  Administered 2015-09-30: 0.2 mg via INTRAVENOUS
  Filled 2015-09-30: qty 1

## 2015-09-30 MED ORDER — MORPHINE SULFATE (CONCENTRATE) 10 MG/0.5ML PO SOLN
10.0000 mg | ORAL | Status: DC | PRN
Start: 2015-09-30 — End: 2015-10-01

## 2015-09-30 MED ORDER — MORPHINE SULFATE (PF) 2 MG/ML IV SOLN
2.0000 mg | INTRAVENOUS | Status: DC | PRN
  Administered 2015-09-30 – 2015-10-01 (×4): 4 mg via INTRAVENOUS
  Filled 2015-09-30 (×4): qty 2

## 2015-09-30 NOTE — ED Notes (Signed)
Pt placed on non-rebreather at this time, pt anxious and hyperventilating. EDP made aware. And pt will be left on non rebreather until anxiety lessens

## 2015-09-30 NOTE — ED Notes (Signed)
Pt unresponsive, pupils are fixed, EDP discussed with family about palliative care and family agreeable at this time about this plan

## 2015-09-30 NOTE — H&P (Signed)
[facility] [place] NAME: Bruce May    MR#:  [id number]  DATE OF BIRTH:  [birth year]  DATE OF ADMISSION:  10/03/2015  PRIMARY CARE PHYSICIAN: Lelon Huh, MD   REQUESTING/REFERRING PHYSICIAN: Dr. Hinda Kehr  CHIEF COMPLAINT:   Chief Complaint  Patient presents with  . Code Sepsis    HISTORY OF PRESENT ILLNESS:  Bruce May  is a 75 y.o. 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 Patient is currently unresponsive and history obtained from ER physician and also multiple family members at bedside. 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 He was just started on chemotherapy 2 days ago. Since yesterday he has been feeling short of breath but refused to come to the hospital. He was notably very tachypneic and hypoxic and so family brought him to the hospital today. He was hypoxic here, tachycardic and also tachypneic. Patient is a DO NOT RESUSCITATE. He was initially talking to the ER physician but within minutes he became unresponsive, heart rate went up to 150s and was having gurgling and foaming around his mouth. Diffusely wheezing on exam. Family confirmed his DO NOT RESUSCITATE status and requested comfort care. Wife at the bedside. So he is being admitted for comfort care. Due to his rapid deterioration, labs were done but chest x-ray was not done.   PAST MEDICAL HISTORY:   Past Medical History  Diagnosis Date  . Malignant neoplasm of bladder ([place])   . Hyperthyroidism   . Nodular prostate with  urinary retention   . TIA (transient ischemic attack)   . Acid reflux   . Asthma   . Hypertension   . 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 Temporary cerebral vascular dysfunction 07/29/2014  . Airway hyperreactivity 07/29/2014  . 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 COPD, mild ([place]) 10/11/2014  . TIA (transient ischemic attack)   . Bladder neoplasm     PAST SURGICAL HISTORY:   Past Surgical History  Procedure Laterality Date  . Bladder surgery  2012  . Back surgery    . Peripheral vascular catheterization N/A 09/25/2015    Procedure: Glori Luis Cath Insertion;  Surgeon: Algernon Huxley, MD;  Location: [facility];  Service: Cardiovascular;  Laterality: N/A;    SOCIAL HISTORY:   Social History  Substance Use Topics  . Smoking status: Current Every Day Smoker -- 1.00 packs/day    Types: Cigarettes  . Smokeless tobacco: Not on file  . Alcohol Use: No    FAMILY HISTORY:   Family History  Problem Relation Age of Onset  . Kidney disease Sister   . Diabetes Sister   . Bladder Cancer Neg Hx   . Prostate cancer Neg Hx     DRUG ALLERGIES:  No Known Allergies  REVIEW OF SYSTEMS:   Review of Systems  Unable to perform ROS: critical illness    MEDICATIONS AT HOME:   Prior to Admission medications   Medication Sig Start Date End Date Taking? Authorizing Provider  albuterol (PROVENTIL HFA;VENTOLIN HFA) 108 (90 BASE) MCG/ACT  inhaler Inhale 2 puffs into the lungs 4 (four) times daily as needed.     Historical Provider, MD  calcium-vitamin D (OSCAL WITH D) 500-200 MG-UNIT tablet Take 1 tablet by mouth 2 (two) times daily.    Historical Provider, MD  cephALEXin (KEFLEX) 500 MG capsule Take 1 capsule (500 mg total) by mouth 2 (two) times daily. 09/23/15   Lisa Roca, MD  cetirizine (ZYRTEC) 10 MG tablet Take 10 mg by mouth daily.    Historical Provider, MD  fenofibrate 160 MG tablet Take 1 tablet (160 mg total) by mouth daily.  06/12/15   Vickki Muff Chrismon, PA  finasteride (PROSCAR) 5 MG tablet Take 1 tablet (5 mg total) by mouth daily. 12/07/14   Collier Flowers, MD  fluticasone furoate-vilanterol (BREO ELLIPTA) 200-25 MCG/INH AEPB Inhale 2 puffs into the lungs daily.    Historical Provider, MD  furosemide (LASIX) 40 MG tablet  09/03/15   Historical Provider, MD  HYDROcodone-acetaminophen (NORCO) 5-325 MG tablet Take 1 tablet by mouth every 6 (six) hours as needed for moderate pain. 09/20/15   Lequita Asal, MD  levothyroxine (SYNTHROID, LEVOTHROID) 100 MCG tablet Take 100 mcg by mouth daily before breakfast.    Historical Provider, MD  lidocaine-prilocaine (EMLA) cream Apply 1 application topically as needed. Apply about 1/2 tablespoon of medication and cover 1-2 hours before the use of your port 09/20/15   Lequita Asal, MD  mirtazapine (REMERON) 7.5 MG tablet Take 7.5 mg by mouth at bedtime.    Historical Provider, MD  Multiple Vitamin (MULTIVITAMIN WITH MINERALS) TABS tablet Take 1 tablet by mouth daily.    Historical Provider, MD  nystatin (MYCOSTATIN) 100000 UNIT/ML suspension Take 5 mLs by mouth 4 (four) times daily. Reported on 09/07/2015    Historical Provider, MD  Omega-3 Fatty Acids (FISH OIL EXTRA STRENGTH) 1200 MG CAPS Take by mouth 2 (two) times daily.     Historical Provider, MD  omeprazole (PRILOSEC) 40 MG capsule TAKE ONE CAPSULE BY MOUTH ONCE DAILY AS NEEDED 08/16/15   Vickki Muff Chrismon, PA  ondansetron (ZOFRAN) 8 MG tablet Take 1 tablet (8 mg total) by mouth 2 (two) times daily as needed for refractory nausea / vomiting. Start on day 3 after carboplatin chemo. 09/18/15   Lequita Asal, MD  pravastatin (PRAVACHOL) 80 MG tablet TAKE ONE TABLET BY MOUTH ONCE DAILY 03/15/15   Birdie Sons, MD  tiotropium (SPIRIVA) 18 MCG inhalation capsule Place 18 mcg into inhaler and inhale 2 (two) times daily.     Historical Provider, MD      VITAL SIGNS:  Blood pressure 144/81, pulse 149, temperature 98.8 F  (37.1 C), temperature source Oral, resp. rate 24, height '5\' 9"'$  (1.753 m), weight 65.998 kg (145 lb 8 oz), SpO2 98 %.  PHYSICAL EXAMINATION:   Physical Exam  GENERAL:  75 y.o.-year-old patient critically ill appearing patient lying in the bed. Gasping, tachypneic, tachycardic, froathing around the mouth EYES: Pupils equal, round, sluggish reaction to light and accommodation. No scleral icterus.  HEENT: Head atraumatic, normocephalic. Oropharynx and nasopharynx clear.  NECK:  Supple, no jugular venous distention. No thyroid enlargement, no tenderness.  LUNGS: gurgling, coarse breath sounds bilaterally, diffuse  Wheezing noted. using accessory muscles to breathe.  CARDIOVASCULAR: S1, S2 rapid but regular. No murmurs, rubs, or gallops.  ABDOMEN: Soft, nontender, nondistended. Bowel sounds present. No organomegaly or mass.  EXTREMITIES: No pedal edema, cyanosis, or clubbing.  NEUROLOGIC: Unresponsive, withdrawing to pain. PSYCHIATRIC: The patient is  unresponsive.  SKIN: No obvious rash, lesion, or ulcer.   LABORATORY PANEL:   CBC  Recent Labs Lab 10/21/2015 1810  WBC 19.9*  HGB 8.9*  HCT 26.8*  PLT 332   ------------------------------------------------------------------------------------------------------------------  Chemistries   Recent Labs Lab 09/29/2015 1810  NA 138  K 3.4*  CL 103  CO2 26  GLUCOSE 123*  BUN 36*  CREATININE 1.21  CALCIUM 7.3*  AST 42*  ALT 18  ALKPHOS 96  BILITOT 0.6   ------------------------------------------------------------------------------------------------------------------  Cardiac Enzymes  Recent Labs Lab 10/14/2015 1810  TROPONINI 0.06*   ------------------------------------------------------------------------------------------------------------------  RADIOLOGY:  No results found.  EKG:   Orders placed or performed during the hospital encounter of 10/23/2015  . EKG 12-Lead  . EKG 12-Lead    IMPRESSION AND PLAN:   Bruce May  is a 75 y.o. 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 Rapid deterioration in the ER, tachycardia, tachypnea and hypoxic respiratory failure. Patient is a DNR... And family requested comfort care.  Final diagnosis:  1. Acute hypoxic respiratory failure- could be from acute pulmonary edema vs PE 2. Metastatic small cell lung cancer- with mets to bones liver and brain 3. Leukocytosis- could be from neulasta 4. Hypokalemia 5. COPD 6. Bladder cancer  Will admit for comfort care. IV morphine, ativan prn, roxanol, robinul 1 dose of lasix Palliative care consult Family agreeable and at bedside including wife and multiple others.    All the records are reviewed and case discussed with ED provider. Management plans discussed with the patient, family and they are in agreement.  CODE STATUS: DNR  TOTAL TIME TAKING CARE OF THIS PATIENT: 55 minutes.    Gladstone Lighter M.D on 10/20/2015 at 8:04 PM  Between 7am to 6pm - Pager - [phone]  After 6pm go to [email or web address] - password [email or web address] [facility]  Office  [phone]  CC: Primary care physician; Lelon Huh, MD

## 2015-09-30 NOTE — ED Notes (Signed)
Family at bedside talking to MD

## 2015-09-30 NOTE — ED Notes (Signed)
Patient arrives to ED with ShOB congestion and cough for 2 days. Last chemo treatment 09-28-15

## 2015-09-30 NOTE — ED Notes (Signed)
Pt very anxious, stating "i cant breathe" pts oxygen sat at 98%, EDP made aware and verbal orders received for duoneb and ativan

## 2015-09-30 NOTE — ED Notes (Signed)
Pt becoming less responsive, EDP at beside, pt placed on 15L non-rebreather. Per family pt in DNR/DNI

## 2015-09-30 NOTE — ED Notes (Signed)
Family updated on room assignment

## 2015-09-30 NOTE — Progress Notes (Signed)
Chaplain rounded the unit to provide a compassionate presence and support to the patient and family.  Bruce May [phone]

## 2015-09-30 NOTE — ED Notes (Signed)
Chaplain paged  

## 2015-09-30 NOTE — ED Provider Notes (Signed)
[facility] Emergency Department Provider Note  ____________________________________________  Time seen: Approximately 6:01 PM  I have reviewed the triage vital signs and the nursing notes.   HISTORY  Chief Complaint Code Sepsis    HPI Bruce May is a 75 y.o. male with an extensive medical history of metastatic cancer, started last week on chemotherapy by Dr. Mike Gip, with mets to brain, bones, liver, and more, with extensive tumors in his chest per family.  Presents by private vehicle for worsening shortness of breath.  SOB has been gradual in onset over the last several days, but patient refused to come to the ED until today as gradually worsening symptoms have become severe.  Noted to have spO2 86% on RA in triage with tachycardia and tachypnea, so I was alerted to possible Code Sepsis.   Patient reports SOB is severe and worse with exertion.  Endorses worsening congestion and cough for about 2 days.  Denies CP, abd pain, fever/chills, N/V/D, dysuria.  Coughing multiple times in ED exam room.      Past Medical History  Diagnosis Date  . Malignant neoplasm of bladder ([place])   . Hyperthyroidism   . Nodular prostate with urinary retention   . TIA (transient ischemic attack)   . Acid reflux   . Asthma   . Hypertension   . 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 Temporary cerebral vascular dysfunction 07/29/2014  . Airway hyperreactivity 07/29/2014  . 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 COPD, mild ([place]) 10/11/2014  . TIA (transient ischemic attack)   . Bladder neoplasm     Patient Active Problem List   Diagnosis Date Noted  . Acute respiratory distress (HCC) 10/02/2015  . Small cell lung cancer ([place]) 09/10/2015  . Liver metastases ([place]) 09/10/2015  . Bone metastasis ([place]) 09/07/2015  . Hyponatremia 08/30/2015  . Mechanical low back pain 08/03/2015  . Lumbar canal stenosis 08/03/2015  .  Hypothyroidism 12/07/2014  . Weak pulse 10/11/2014  . Hypercholesterolemia without hypertriglyceridemia 10/11/2014  . Skin lesion 10/11/2014  . Adult hypothyroidism 10/11/2014  . H/O transient cerebral ischemia 10/11/2014  . H/O primary malignant neoplasm of urinary bladder 10/11/2014  . Fatigue 10/11/2014  . Blood pressure elevated 10/11/2014  . COPD, mild ([place]) 10/11/2014  . Benign fibroma of prostate 10/11/2014  . Malignant neoplasm of bladder ([place]) 07/29/2014  . Acid reflux 07/29/2014  . Arthritis 07/29/2014  . Airway hyperreactivity 07/29/2014  . HLD (hyperlipidemia) 07/29/2014  . Hyperthyroidism 07/29/2014  . Cancer of lateral wall of urinary bladder ([place]) 07/29/2014  . Benign prostatic hyperplasia with urinary obstruction 07/29/2014  . Temporary cerebral vascular dysfunction 07/29/2014  . Chronic kidney disease (CKD), stage III (moderate) 12/14/2013  . Bladder retention 03/17/2012  . Bladder cancer ([place]) 03/17/2012  . Incomplete bladder emptying 03/17/2012  . Malignant neoplasm of urinary bladder ([place]) 03/17/2012  . 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 Bladder surgery  2012  . Back surgery    . Peripheral vascular catheterization N/A 09/25/2015    Procedure: Glori Luis Cath Insertion;  Surgeon: Algernon Huxley, MD;  Location: [facility];  Service: Cardiovascular;  Laterality: N/A;    No current outpatient prescriptions on file.  Allergies Review of patient's allergies indicates no known allergies.  Family History  Problem Relation Age of Onset  . Kidney disease Sister   . Diabetes Sister   .  Bladder Cancer Neg Hx   . Prostate cancer Neg Hx     Social History Social History  Substance Use Topics  . Smoking status: Current Every Day Smoker -- 1.00 packs/day    Types: Cigarettes  . Smokeless tobacco: None  . Alcohol Use: No    Review of Systems Constitutional: No  fever/chills Eyes: No visual changes. ENT: No sore throat. Cardiovascular: Denies chest pain. Respiratory: Severe shortness of breath. Gastrointestinal: No abdominal pain.  No nausea, no vomiting.  No diarrhea.  No constipation. Genitourinary: Negative for dysuria. Musculoskeletal: Negative for back pain. Skin: Negative for rash. Neurological: Negative for headaches, focal weakness or numbness.  10-point ROS otherwise negative.  ____________________________________________   PHYSICAL EXAM:  VITAL SIGNS: ED Triage Vitals  Enc Vitals Group     BP 10/06/2015 1749 158/82 mmHg     Pulse Rate 10/14/2015 1749 111     Resp 10/25/2015 1749 28     Temp 09/29/2015 1749 98.8 F (37.1 C)     Temp Source 10/23/2015 1749 Oral     SpO2 10/21/2015 1749 86 %     Weight 10/20/2015 1749 145 lb 8 oz (65.998 kg)     Height 10/06/2015 1749 '5\' 9"'$  (1.753 m)     Head Cir --      Peak Flow --      Pain Score --      Pain Loc --      Pain Edu? --      Excl. in [place]? --     Constitutional: Alert and oriented. Joking with me and interacting appropriately but with increased WOB. Eyes: Conjunctivae are normal. PERRL. EOMI. Head: Atraumatic. Nose: No congestion/rhinnorhea. Mouth/Throat: Mucous membranes are moist.  Oropharynx non-erythematous. Neck: No stridor.  No meningeal signs.   Cardiovascular: Tachycardia, regular rhythm. Good peripheral circulation. Grossly normal heart sounds.   Respiratory: Increased respiratory effort.  Mild retractions but improved on O2 [place]. Coarse breath sounds throughout, frequent cough productive of small amount of sputum.  Tachypnea in the upper 20s Gastrointestinal: Soft and nontender. No distention.  Musculoskeletal: No lower extremity tenderness nor edema. No gross deformities of extremities. Neurologic:  Normal speech and language. No gross focal neurologic deficits are appreciated.  Skin:  Skin is warm, dry and intact. No rash noted. Psychiatric: Mood and affect are normal. Speech  and behavior are normal.  ____________________________________________   LABS (all labs ordered are listed, but only abnormal results are displayed)  Labs Reviewed  COMPREHENSIVE METABOLIC PANEL - Abnormal; Notable for the following:    Potassium 3.4 (*)    Glucose, Bld 123 (*)    BUN 36 (*)    Calcium 7.3 (*)    Total Protein 6.3 (*)    Albumin 2.9 (*)    AST 42 (*)    GFR calc non Af Amer 56 (*)    All other components within normal limits  URINALYSIS COMPLETEWITH MICROSCOPIC ([facility] ONLY) - Abnormal; Notable for the following:    Color, Urine YELLOW (*)    APPearance CLEAR (*)    Protein, ur 100 (*)    Bacteria, UA RARE (*)    All other components within normal limits  CBC WITH DIFFERENTIAL/PLATELET - Abnormal; Notable for the following:    WBC 19.9 (*)    RBC 3.03 (*)    Hemoglobin 8.9 (*)    HCT 26.8 (*)    RDW 15.3 (*)    Neutro Abs 17.9 (*)    Monocytes Absolute 0.1 (*)  All other components within normal limits  TROPONIN I - Abnormal; Notable for the following:    Troponin I 0.06 (*)    All other components within normal limits  PROTIME-INR - Abnormal; Notable for the following:    Prothrombin Time 15.7 (*)    All other components within normal limits  CULTURE, BLOOD (ROUTINE X 2)  CULTURE, BLOOD (ROUTINE X 2)  URINE CULTURE  LACTIC ACID, PLASMA  LIPASE, BLOOD  APTT   ____________________________________________  EKG  ED ECG REPORT I, Thorsten Climer, the attending physician, personally viewed and interpreted this ECG.  Date: 10/03/2015 EKG Time: 17:48 Rate: 112 Rhythm: sinus tachycardia QRS Axis: normal Intervals: normal ST/T Wave abnormalities: Non-specific ST segment / T-wave changes, but no evidence of acute ischemia. Conduction Disturbances: none Narrative Interpretation: unremarkable  ____________________________________________  RADIOLOGY   No results  found.  ____________________________________________   PROCEDURES  Procedure(s) performed: None  Critical Care performed: Yes, see critical care note(s)  CRITICAL CARE Performed by: Hinda Kehr   Total critical care time: 60 minutes  Critical care time was exclusive of separately billable procedures and treating other patients.  Critical care was necessary to treat or prevent imminent or life-threatening deterioration.  Critical care was time spent personally by me on the following activities: development of treatment plan with patient and/or surrogate as well as nursing, discussions with consultants, evaluation of patient's response to treatment, examination of patient, obtaining history from patient or surrogate, ordering and performing treatments and interventions, ordering and review of laboratory studies, ordering and review of radiographic studies, pulse oximetry and re-evaluation of patient's condition.  ____________________________________________   INITIAL IMPRESSION / ASSESSMENT AND PLAN / ED COURSE  Pertinent labs & imaging results that were available during my care of the patient were reviewed by me and considered in my medical decision making (see chart for details).  Concerning for sepsis - tachycardia, tachypnea, hypoxemia.  Likely pneumonia, possibly post-obstructive given history of tumors.  Breath sounds coarse, but without wheezing.  Treating empirically with IVF 30 mL/kg per protocol, empiric antibiotics, full sepsis workup.  Anticipate admission.   (Note that documentation was delayed due to multiple ED patients requiring immediate care.)   Patient began to decompensate, initially in terms of increased anxiety and restlessness, feeling like he could not breathe though O2 sats maintained.  Discussed with wife and daughter who confirmed that though the patient does not have signed DNR/DNI, he was very explicit that he did NOT want to be kept alive "by  machines", and that they do not want intubation or aggressive intervention if he decompensates further, knowing the extent of his metastatic cancer.  I provided Duoneb, NRB, and Ativan 1 mg to help with anxiety.  This did not help as thoroughly as I anticipated/hoped.  Discussed plan with Shirlee Limerick (RN), and I decided to give low-dose ketamine 30 mg IV to help with anxiety and comfort, but when she and I re-entered the room together to give the ketamine, the patient was unresponsive, still tachypneic and tachycardiac but with agonal respirations.  I asked the family and reportedly he had been like that for about 5 minutes.  Vitals remained the same.  I applied sternal rub, and the patient grimaced but did not respond.  I checked pupils and they were fixed and unresponsive.  Gathered family quickly and explained he has rapidly gotten worse, unsure if PE, stroke, flash pulmonary edema, ACS, etc.  Highly doubt seizure - no seizure-like activity was witnessed, was not post-ictal, eyes  not deviated; presentation more concerning for intracranial hemorrhage or CVA.  Verified with all family members present that patient is DNR/DNI.  After lengthy discussion, agreed on end-of-life order (palliative care is not currently on call).  Admitted to hospitalist for palliative care.  Checked on patient multiple times while still in ED, remained critically ill but with consistent vitals.  Developed "frothing" at mouth consistent with pulmonary edema.  No further interventions except comfort care as per family wishes. GCS 3 because only grimaces to pain.   ____________________________________________  FINAL CLINICAL IMPRESSION(S) / ED DIAGNOSES  Final diagnoses:  Acute respiratory failure with hypoxia (HCC)  Small cell lung cancer, unspecified laterality (HCC)  Unresponsive     MEDICATIONS GIVEN DURING THIS VISIT:  Medications  acetaminophen (TYLENOL) tablet 650 mg (not administered)    Or  acetaminophen (TYLENOL)  suppository 650 mg (not administered)  haloperidol (HALDOL) 2 MG/ML solution 0.5 mg (not administered)    Or  haloperidol lactate (HALDOL) injection 0.5 mg (not administered)  ondansetron (ZOFRAN) injection 4 mg (4 mg Intravenous Given 09/29/2015 1935)  glycopyrrolate (ROBINUL) injection 0.2 mg (0.2 mg Intravenous Given 10/08/2015 1946)  antiseptic oral rinse (BIOTENE) solution 15 mL (not administered)  polyvinyl alcohol (LIQUIFILM TEARS) 1.4 % ophthalmic solution 1 drop (not administered)  furosemide (LASIX) injection 40 mg (40 mg Intravenous Not Given 10/11/2015 2230)  morphine 2 MG/ML injection 2-4 mg (4 mg Intravenous Given 09/29/2015 2229)  morphine CONCENTRATE 10 MG/0.5ML oral solution 10 mg (not administered)  LORazepam (ATIVAN) injection 1 mg (not administered)  sodium chloride 0.9 % bolus 1,000 mL (0 mLs Intravenous Stopped 09/28/2015 1840)    And  sodium chloride 0.9 % bolus 1,000 mL (0 mLs Intravenous Stopped 10/23/2015 1840)  ceFEPIme (MAXIPIME) 2 g in dextrose 5 % 50 mL IVPB (0 g Intravenous Stopped 10/12/2015 1913)  vancomycin (VANCOCIN) IVPB 1000 mg/200 mL premix (0 mg Intravenous Stopped 10/07/2015 1917)  ipratropium-albuterol (DUONEB) 0.5-2.5 (3) MG/3ML nebulizer solution 6 mL (6 mLs Nebulization Given 10/11/2015 1845)  LORazepam (ATIVAN) injection 1 mg (1 mg Intravenous Given 10/04/2015 1845)  morphine 4 MG/ML injection 4 mg (4 mg Intravenous Given 10/20/2015 1945)     NEW OUTPATIENT MEDICATIONS STARTED DURING THIS VISIT:  Current Discharge Medication List        Note:  This document was prepared using Dragon voice recognition software and may include unintentional dictation errors.   Hinda Kehr, MD 10/06/2015 2312

## 2015-10-01 MED ORDER — GLYCOPYRROLATE 0.2 MG/ML IJ SOLN
0.2000 mg | INTRAMUSCULAR | Status: DC | PRN
  Administered 2015-10-01: 0.2 mg via INTRAVENOUS
  Filled 2015-10-01 (×2): qty 1

## 2015-10-02 LAB — URINE CULTURE: CULTURE: NO GROWTH

## 2015-10-02 NOTE — Progress Notes (Signed)
[facility] day:  10/03/2015  Chief Complaint: Bruce May is a 75 y.o. male with extensive stage small cell lung cancer who is seen for assessment after recent hospitalization.  HPI:  Patient returns to clinic today for further evaluation and initiation of cycle 1 of carboplatinum and etoposide for his stage IV small cell lung carcinoma. He had his port placed yesterday without incident. He continues to feel increased weakness and fatigue, but otherwise feels well. He has a poor appetite and continues to lose weight. He has no neurologic complaints. He denies any fevers. He denies any chest pain, shortness of breath, cough, or hemoptysis. He denies any nausea, vomiting, constipation, or diarrhea. He has no urinary complaints. Patient otherwise feels well and offers no further specific complaints.   Past Medical History  Diagnosis Date  . Malignant neoplasm of bladder ([place])   . Hyperthyroidism   . Nodular prostate with urinary retention   . TIA (transient ischemic attack)   . Acid reflux   . Asthma   . Hypertension   . 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 Temporary cerebral vascular dysfunction 07/29/2014  . Airway hyperreactivity 07/29/2014  . 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 COPD, mild ([place]) 10/11/2014  . TIA (transient ischemic attack)   . Bladder neoplasm     Past Surgical History  Procedure Laterality Date  . Bladder surgery  2012  . Back surgery    . Peripheral vascular catheterization N/A 09/25/2015    Procedure: Glori Luis Cath Insertion;  Surgeon: Algernon Huxley, MD;  Location: [facility];  Service: Cardiovascular;  Laterality: N/A;    Family History  Problem Relation Age of Onset  . Kidney disease Sister   . Diabetes Sister   . Bladder Cancer Neg Hx   . Prostate cancer Neg Hx     Social History:  reports that he has been smoking Cigarettes.  He has been smoking about  1.00 pack per day. He does not have any smokeless tobacco history on file. He reports that he does not drink alcohol or use illicit drugs.  The patient has smoked 1 pack a day for 66 years. He started smoking at age 21. He lives with his wife in [place]. He is retired. He ran heavy equipment.  His family states that he has a 3rd grade education.  The patient is accompanied by 6 family members including his wife, 2 sons, 2 daughters, and a son-in-law today.  Allergies: No Known Allergies  Current Medications: Current Outpatient Prescriptions  Medication Sig Dispense Refill  . albuterol (PROVENTIL HFA;VENTOLIN HFA) 108 (90 BASE) MCG/ACT inhaler Inhale 2 puffs into the lungs 4 (four) times daily as needed.     . calcium-vitamin D (OSCAL WITH D) 500-200 MG-UNIT tablet Take 1 tablet by mouth 2 (two) times daily.    . cephALEXin (KEFLEX) 500 MG capsule Take 1 capsule (500 mg total) by mouth 2 (two) times daily. 14 capsule 0  . cetirizine (ZYRTEC) 10 MG tablet Take 10 mg by mouth daily.    . fenofibrate 160 MG tablet Take 1 tablet (160 mg total) by mouth daily. 90 tablet 3  . finasteride (PROSCAR) 5 MG tablet Take 1 tablet (5 mg total) by mouth daily. 90 tablet 3  . fluticasone furoate-vilanterol (BREO ELLIPTA) 200-25 MCG/INH AEPB Inhale 2 puffs into the lungs daily.    . furosemide (LASIX) 40 MG tablet     .  HYDROcodone-acetaminophen (NORCO) 5-325 MG tablet Take 1 tablet by mouth every 6 (six) hours as needed for moderate pain. 40 tablet 0  . levothyroxine (SYNTHROID, LEVOTHROID) 100 MCG tablet Take 100 mcg by mouth daily before breakfast.    . lidocaine-prilocaine (EMLA) cream Apply 1 application topically as needed. Apply about 1/2 tablespoon of medication and cover 1-2 hours before the use of your port 30 g 0  . mirtazapine (REMERON) 7.5 MG tablet Take 7.5 mg by mouth at bedtime.    . Multiple Vitamin (MULTIVITAMIN WITH MINERALS) TABS tablet Take 1 tablet by mouth daily.    Marland Kitchen nystatin  (MYCOSTATIN) 100000 UNIT/ML suspension Take 5 mLs by mouth 4 (four) times daily. Reported on 09/07/2015    . Omega-3 Fatty Acids (FISH OIL EXTRA STRENGTH) 1200 MG CAPS Take by mouth 2 (two) times daily.     Marland Kitchen omeprazole (PRILOSEC) 40 MG capsule TAKE ONE CAPSULE BY MOUTH ONCE DAILY AS NEEDED 90 capsule 0  . ondansetron (ZOFRAN) 8 MG tablet Take 1 tablet (8 mg total) by mouth 2 (two) times daily as needed for refractory nausea / vomiting. Start on day 3 after carboplatin chemo. 30 tablet 1  . pravastatin (PRAVACHOL) 80 MG tablet TAKE ONE TABLET BY MOUTH ONCE DAILY 90 tablet 3  . tiotropium (SPIRIVA) 18 MCG inhalation capsule Place 18 mcg into inhaler and inhale 2 (two) times daily.      No current facility-administered medications for this visit.    Review of Systems:  GENERAL:  Weakness and fatigue.  Weight loss. PERFORMANCE STATUS (ECOG):  2 HEENT:  Decreased hearing.  No visual changes, runny nose, sore throat, mouth sores or tenderness. Lungs: No shortness of breath or cough.  No hemoptysis. Cardiac:  No chest pain, palpitations, orthopnea, or PND. GI:  No nausea, vomiting, diarrhea, constipation, melena or hematochezia. GU:  No urgency, frequency, dysuria, or hematuria.  History of bladder cancer. Musculoskeletal:  No back pain.  No joint pain.  No muscle tenderness. Extremities:  No pain or swelling. Skin:  No rashes or skin changes. Neuro:  No headache, numbness or weakness, balance or coordination issues. Endocrine:  No diabetes.  Thyroid disease on Synthroid.  No hot flashes or night sweats. Psych:  No mood changes, depression or anxiety. Pain:  No focal pain.  Review of systems:  All other systems reviewed and found to be negative.  Physical Exam: Blood pressure 152/77, pulse 84, temperature 97.8 F (36.6 C), temperature source Oral, resp. rate 18, weight 144 lb (65.318 kg).   General: Well-developed, well-nourished, no acute distress. Sitting in a wheelchair.  Eyes: Pink  conjunctiva, anicteric sclera. Lungs: Clear to auscultation bilaterally. Heart: Regular rate and rhythm. No rubs, murmurs, or gallops. Abdomen: Soft, nontender, nondistended. No organomegaly noted, normoactive bowel sounds. Musculoskeletal: No edema, cyanosis, or clubbing. Neuro: Alert, answering all questions appropriately. Cranial nerves grossly intact. Skin: No rashes or petechiae noted. Psych: Normal affect.   Infusion on 09/26/2015  Component Date Value Ref Range Status  . WBC 09/26/2015 8.4  3.8 - 10.6 K/uL Final  . RBC 09/26/2015 3.06* 4.40 - 5.90 MIL/uL Final  . Hemoglobin 09/26/2015 9.3* 13.0 - 18.0 g/dL Final  . HCT 09/26/2015 27.0* 40.0 - 52.0 % Final  . MCV 09/26/2015 88.2  80.0 - 100.0 fL Final  . MCH 09/26/2015 30.4  26.0 - 34.0 pg Final  . MCHC 09/26/2015 34.5  32.0 - 36.0 g/dL Final  . RDW 09/26/2015 15.0* 11.5 - 14.5 % Final  . Platelets  09/26/2015 379  150 - 440 K/uL Final  . Neutrophils Relative % 09/26/2015 68   Final  . Neutro Abs 09/26/2015 5.8  1.4 - 6.5 K/uL Final  . Lymphocytes Relative 09/26/2015 20   Final  . Lymphs Abs 09/26/2015 1.7  1.0 - 3.6 K/uL Final  . Monocytes Relative 09/26/2015 10   Final  . Monocytes Absolute 09/26/2015 0.8  0.2 - 1.0 K/uL Final  . Eosinophils Relative 09/26/2015 1   Final  . Eosinophils Absolute 09/26/2015 0.1  0 - 0.7 K/uL Final  . Basophils Relative 09/26/2015 1   Final  . Basophils Absolute 09/26/2015 0.1  0 - 0.1 K/uL Final  . Sodium 09/26/2015 135  135 - 145 mmol/L Final  . Potassium 09/26/2015 3.7  3.5 - 5.1 mmol/L Final  . Chloride 09/26/2015 103  101 - 111 mmol/L Final  . CO2 09/26/2015 25  22 - 32 mmol/L Final  . Glucose, Bld 09/26/2015 219* 65 - 99 mg/dL Final  . BUN 09/26/2015 23* 6 - 20 mg/dL Final  . Creatinine, Ser 09/26/2015 0.90  0.61 - 1.24 mg/dL Final  . Calcium 09/26/2015 8.4* 8.9 - 10.3 mg/dL Final  . Total Protein 09/26/2015 6.9  6.5 - 8.1 g/dL Final  . Albumin 09/26/2015 2.9* 3.5 - 5.0 g/dL Final   . AST 09/26/2015 45* 15 - 41 U/L Final  . ALT 09/26/2015 16* 17 - 63 U/L Final  . Alkaline Phosphatase 09/26/2015 98  38 - 126 U/L Final  . Total Bilirubin 09/26/2015 0.4  0.3 - 1.2 mg/dL Final  . GFR calc non Af Amer 09/26/2015 >60  >60 mL/min Final  . GFR calc Af Amer 09/26/2015 >60  >60 mL/min Final   Comment: (NOTE) The eGFR has been calculated using the CKD EPI equation. This calculation has not been validated in all clinical situations. eGFR's persistently <60 mL/min signify possible Chronic Kidney Disease.   . Anion gap 09/26/2015 7  5 - 15 Final  Appointment on 09/26/2015  Component Date Value Ref Range Status  . CEA 09/26/2015 15.4* 0.0 - 4.7 ng/mL Final   Comment: (NOTE)       Roche ECLIA methodology       Nonsmokers  <3.9                                     Smokers     <5.6 Performed At: [facility] [place], [place] [id number] Lindon Romp MD [id number]   . LDH 09/26/2015 390* 98 - 192 U/L Final    Assessment:  Bruce May is a 75 y.o. male with extensive stage small cell lung cancer.  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   PET scan on 09/07/2015 revealed a hypermetabolic left upper lobe perihilar mass consistent with bronchogenic carcinoma (SUV 14.6). There was widespread metastatic disease to multiple mediastinal lymph nodes (1cm right paratracheal node with SUV 9.9: AP window node 1.2 cm with SUV 11.3; 1.9 x 3.6 cm subcarinal node with SUV 12.1), multiple hypermetabolic liver lesions, multifocal hypermetabolic bone lesions, and bilateral perinephric nodules.  Bone lesions included proximal right humerus and bilateral proximal femurs.   Bronchoscopy on 09/10/2015 revealed a nearly obstructing (greater than 90% obstructed) endobronchial mass proximally at the orifice in the left upper lobe. Endobronchial biopsies confirmed small cell  carcinoma.  He has a history of superficial bladder cancer. Cystoscopy in 07/2015 was negative.  Symptomatically, he is fatigued.  He had a headache this morning.  His family notes some balance issues.  He denies any shortness of breath.  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 Cystoscopy in 07/2015 was negative.  Symptomatically, he is fatigued.  He has had some headaches and balance issues.  Exam including neurologic exam is normal.  Plan: 1.  Small cell lung carcinoma: Patient had port placed yesterday and despite his decreased performance status wishes to proceed with palliative chemotherapy using carboplatinum and etoposide. Proceed with day 1 today. Patient will also receive Xgeva for his bony lesions. Patient will return to clinic in 2 and 3 days for etoposide only. He will then return to clinic in 1 week for further evaluation and to assess his toleration of his treatment. Patient will return to clinic in 3 weeks for consideration of cycle 2. MRI the brain was negative, it was previously discussed with the patient the need for prophylactic cranial radiation.  Side effects of chemotherapy were previously discussed by Dr. Mike Gip.   2.  Discuss code status issues.  Code status is DNR/DNI.  Packet provided patient regarding living will and medical power of attorney.  Patient expressed understanding and was in agreement with the plan. He also understands that he can return to clinic at any time if he has any questions, concerns, or complaints.  Lloyd Huger, MD  10/03/2015, 8:42 AM

## 2015-10-04 ENCOUNTER — Ambulatory Visit: Payer: Medicare Other | Admitting: Hematology and Oncology

## 2015-10-04 ENCOUNTER — Ambulatory Visit: Payer: Medicare Other | Admitting: Radiation Oncology

## 2015-10-04 ENCOUNTER — Other Ambulatory Visit: Payer: Medicare Other

## 2015-10-05 ENCOUNTER — Ambulatory Visit: Payer: Medicare Other

## 2015-10-05 ENCOUNTER — Ambulatory Visit: Payer: Medicare Other | Admitting: Radiation Oncology

## 2015-10-05 ENCOUNTER — Other Ambulatory Visit: Payer: Medicare Other

## 2015-10-05 ENCOUNTER — Ambulatory Visit: Payer: Medicare Other | Admitting: Hematology and Oncology

## 2015-10-05 LAB — CULTURE, BLOOD (ROUTINE X 2)
CULTURE: NO GROWTH
Culture: NO GROWTH

## 2015-10-16 ENCOUNTER — Ambulatory Visit: Payer: Medicare Other

## 2015-10-16 ENCOUNTER — Ambulatory Visit: Payer: Medicare Other | Admitting: Hematology and Oncology

## 2015-10-16 ENCOUNTER — Other Ambulatory Visit: Payer: Medicare Other

## 2015-10-17 ENCOUNTER — Ambulatory Visit: Payer: Medicare Other

## 2015-10-18 ENCOUNTER — Ambulatory Visit: Payer: Medicare Other | Admitting: Hematology and Oncology

## 2015-10-18 ENCOUNTER — Ambulatory Visit: Payer: Medicare Other

## 2015-10-18 ENCOUNTER — Other Ambulatory Visit: Payer: Medicare Other

## 2015-10-27 NOTE — Progress Notes (Signed)
Was in pt's room to do assessment.  Began to have profuse secretions.  Pt appeared in extreme discomfort - trying to sit up.  4 mg morphine given.  Pt unseeing - Began agonal breathing - breathing slowed.  Pt's TOD [id number].  2nd nurse verification was Mallie Snooks.  Dr. Verdell Carmine was paged. Chaplain present and nurse supervisor called.  Pia Mau called [facility].

## 2015-10-27 NOTE — Discharge Summary (Signed)
[place] at [facility]    Death Note:   Bruce May [id number] is a 75 y.o. male, Outpatient Primary MD for the patient is Lelon Huh, MD  Patient Pronounced dead by Stanton Kidney RN on  2015-10-16    @    8:11AM         Brief Summary:   [name] is a 75 y.o. 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 Patient was unresponsive on admission. 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 He was just started on chemotherapy 2 days ago. Came to the hospital for tachypneic and hypoxia. He was hypoxic in ER, tachycardic and also tachypneic.  Patient is a DO NOT RESUSCITATE. He was initially talking to the ER physician but within minutes he became unresponsive, heart rate went up to 150s and was having gurgling and foaming around his mouth. Diffusely wheezing on exam. Family confirmed his DO NOT RESUSCITATE status and requested comfort care. Wife at the bedside. So he was admitted for comfort care. Due to his rapid deterioration, labs were done but chest x-ray was not done.  He was started on morphine, ativan prn, roxanol, robinul prn lasix Palliative care consulted Patient passed away on Oct 16, 2015 at 8:11AM  Final diagnosis:  1. Acute hypoxic respiratory failure- could be from acute pulmonary edema vs PE 2. Metastatic small cell lung cancer- with mets to bones liver and brain 3. Leukocytosis- could be from neulasta 4. Hypokalemia 5. COPD 6. Bladder cancer    Jozey Janco M.D on 10-16-15 at 11:08 AM  [place] [facility] [place]  Total clinical and documentation time for today Under 30 minutes

## 2015-10-27 NOTE — Progress Notes (Signed)
Contacted [facility] regarding patient's death. Information provided. Referral # [id number]. Because of age of patient, decision made to go ahead and release patient to funeral home.

## 2015-10-27 DEATH — deceased

## 2015-11-14 ENCOUNTER — Ambulatory Visit: Payer: Medicare Other | Admitting: Urology

## 2016-04-12 ENCOUNTER — Other Ambulatory Visit: Payer: Self-pay | Admitting: Nurse Practitioner

## 2018-04-22 IMAGING — MR MR HEAD WO/W CM
11 of 13 series · 37 of 48 positions shown · IV contrast (multihance)
Comparison: 08/30/2015 head CT.  09/07/2015 PET-CT.

CLINICAL DATA: Metastatic small cell lung cancer.  Staging.

EXAM:
MRI HEAD WITHOUT AND WITH CONTRAST
TECHNIQUE: Multiplanar, multiecho pulse sequences of the brain and surrounding
structures were obtained without and with intravenous contrast.
CONTRAST:  13mL MULTIHANCE GADOBENATE DIMEGLUMINE 529 MG/ML IV SOLN

[Series 2: T1 · sagittal · 5.0mm · 0.45mm/px · 4 of 29 slices shown (1 of 2)]
[im 1/29]
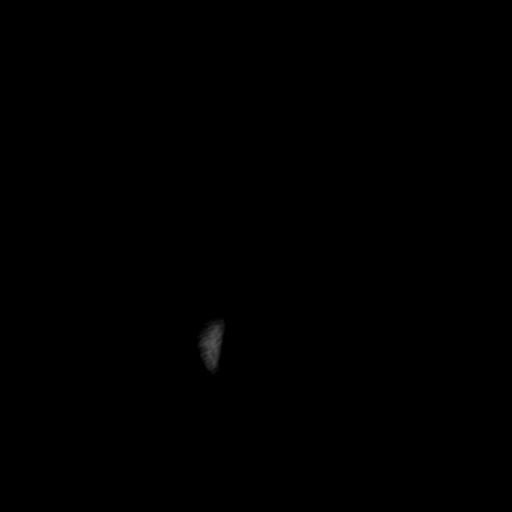
[im 10/29]
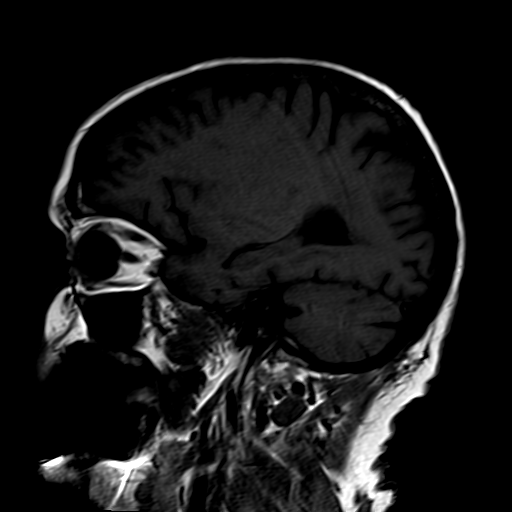
[im 19/29]
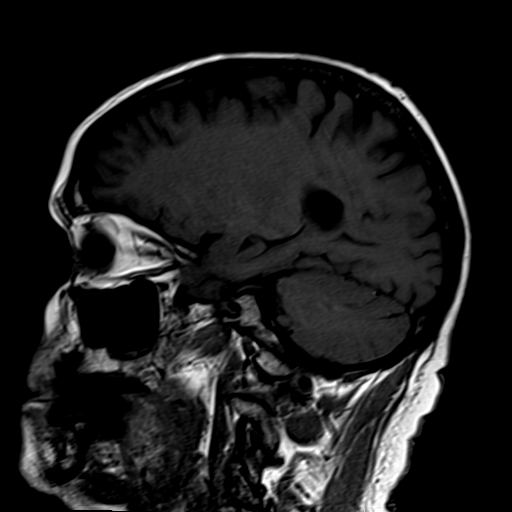
[im 29/29]
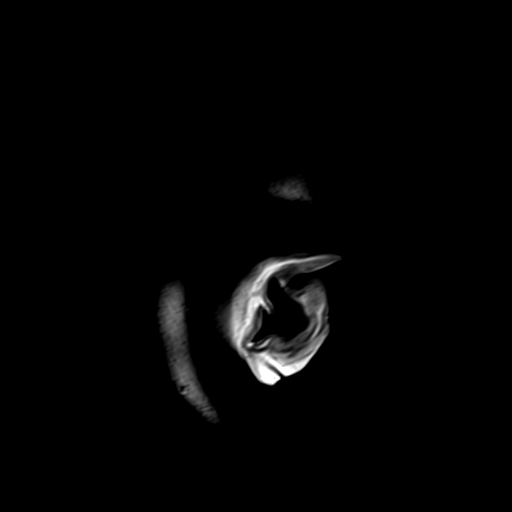

[Series 4: DWI · axial · 3.0mm · 1.80mm/px · z∈[-30,+129]mm · 6 of 52 slices shown (1 of 2)]
[im 1/52]
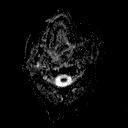
[im 11/52]
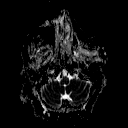
[im 21/52]
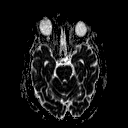
[im 31/52]
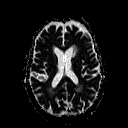
[im 41/52]
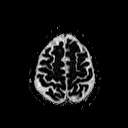
[im 52/52]
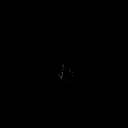

[Series 6: DWI · coronal · 3.0mm · 1.80mm/px · 4 of 49 slices shown (2 of 2)]
[im 1/49]
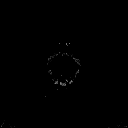
[im 17/49]
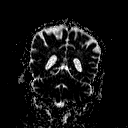
[im 33/49]
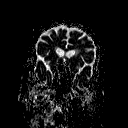
[im 49/49]
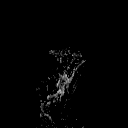

[Series 7: T2 · axial · 5.0mm · 0.60mm/px · z∈[-35,+131]mm · 2 of 27 slices shown (1 of 2)]
[im 1/27]
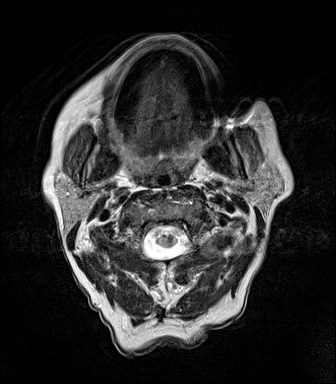
[im 27/27]
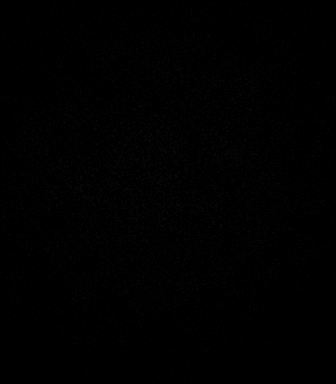

[Series 8: FLAIR · axial · 5.0mm · 0.45mm/px · z∈[-35,+131]mm · 2 of 27 slices shown]
[im 1/27]
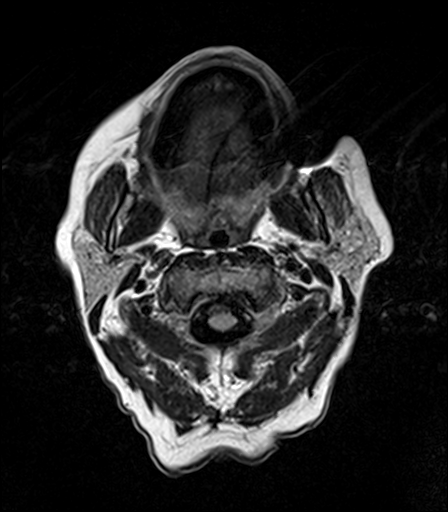
[im 27/27]
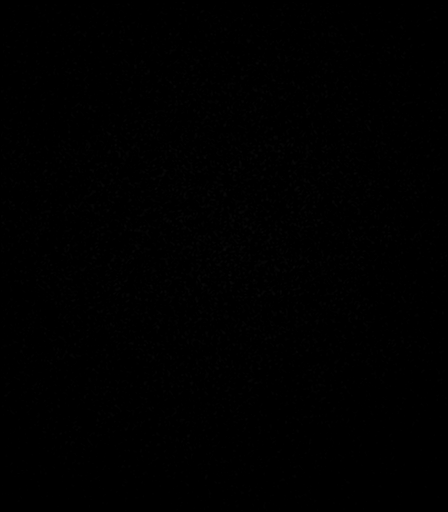

[Series 9: T2 · axial · 5.0mm · 0.45mm/px · z∈[-35,+131]mm · 2 of 27 slices shown (2 of 2)]
[im 1/27]
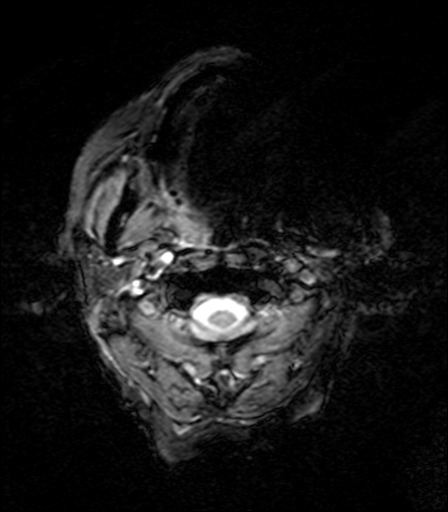
[im 27/27]
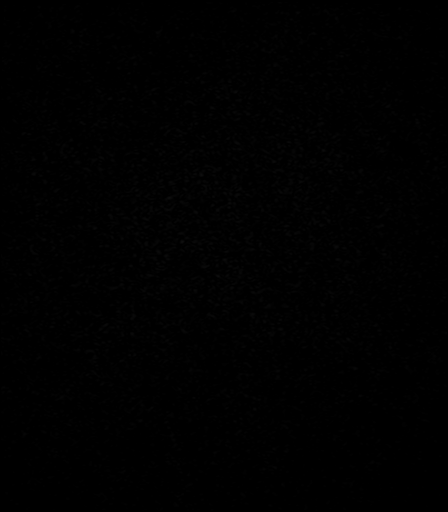

[Series 10: T1 · axial · 3.0mm · 1.00mm/px · z∈[-38,+48]mm · 3 of 60 slices shown (2 of 2)]
[im 1/60]
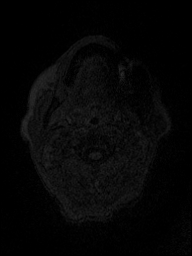
[im 15/60]
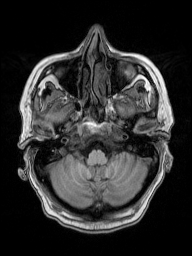
[im 30/60]
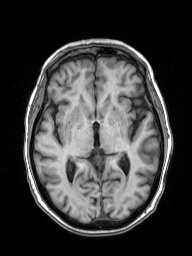

[Series 11: T2 post-contrast · coronal · 5.0mm · 0.49mm/px · 3 of 31 slices shown]
[im 1/31]
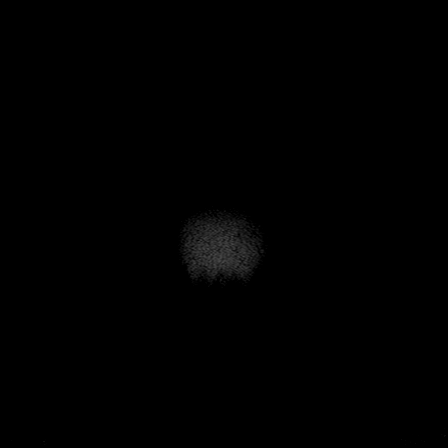
[im 16/31]
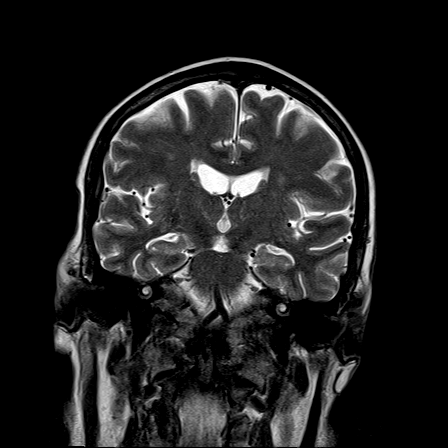
[im 31/31]
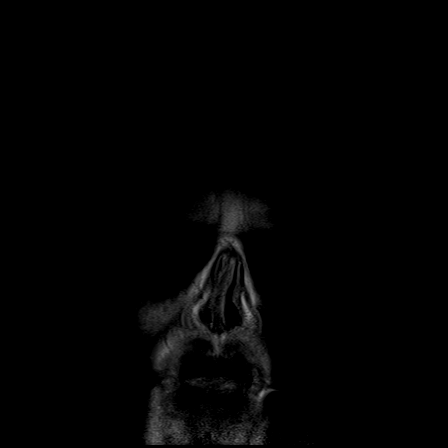

[Series 12: T1 post-contrast · axial · 3.0mm · 1.00mm/px · z∈[-38,+136]mm · 5 of 60 slices shown (1 of 3)]
[im 1/60]
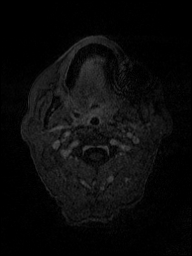
[im 15/60]
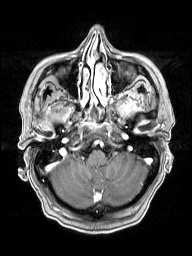
[im 30/60]
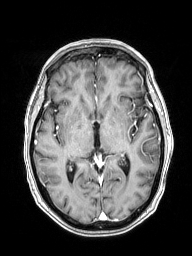
[im 45/60]
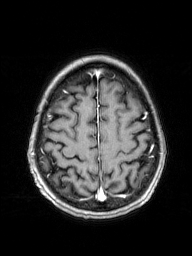
[im 60/60]
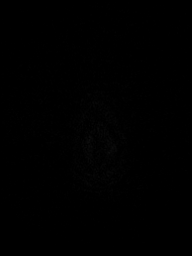

[Series 13: T1 post-contrast · coronal · 5.0mm · 0.43mm/px · 3 of 31 slices shown (2 of 3)]
[im 1/31]
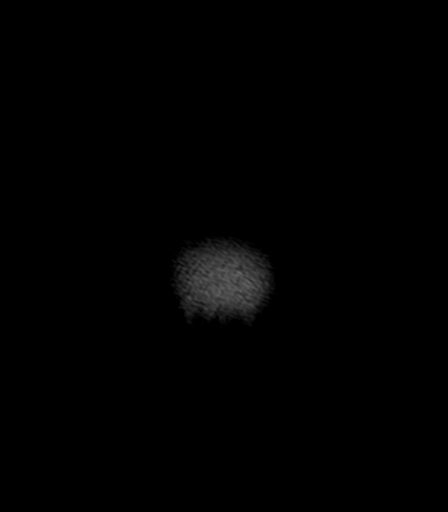
[im 16/31]
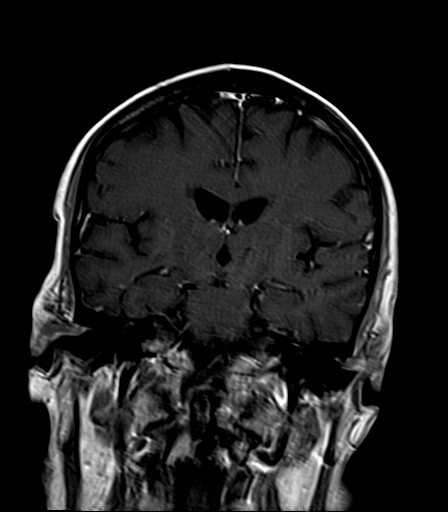
[im 31/31]
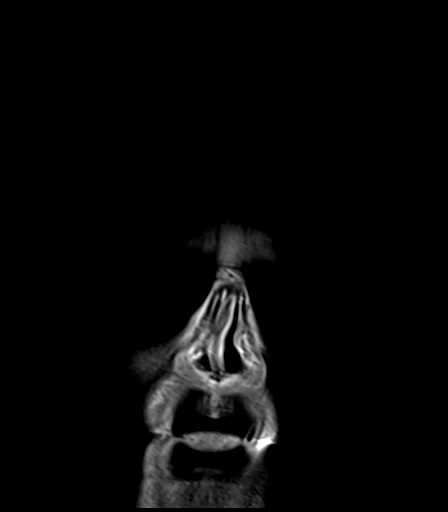

[Series 14: T1 post-contrast · sagittal · 5.0mm · 0.45mm/px · 3 of 29 slices shown (3 of 3)]
[im 1/29]
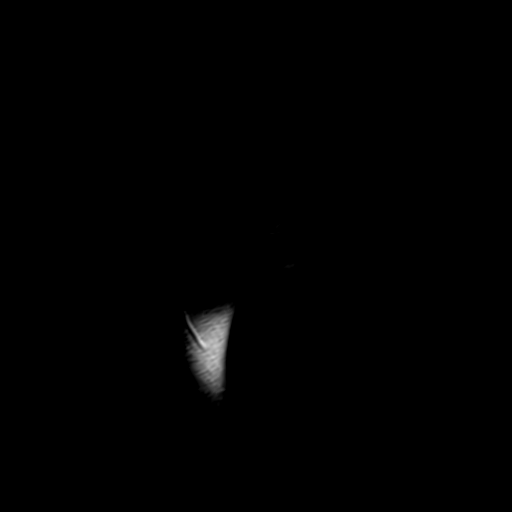
[im 15/29]
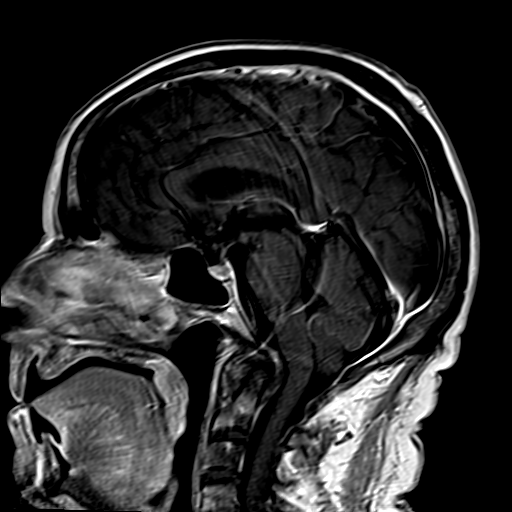
[im 29/29]
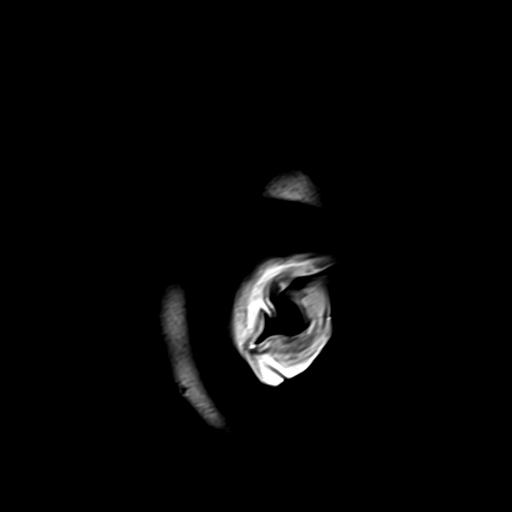

[37 of 48 positions shown; findings below may reference images not displayed]

FINDINGS: T1 hypointense lesion in the C3 vertebral body corresponding to a
metastasis demonstrated on prior PET-CT. Smaller metastasis in the
inferior C2 vertebral body. Diffusely heterogeneous calvarial bone
marrow signal without focal lesion identified.

There is no evidence of acute infarct, intracranial hemorrhage,
midline shift, or extra-axial fluid collection. Ventricles and sulci
are normal for age. A chronic infarct is again seen anteriorly in
the left basal ganglia. Patchy T2 hyperintensities in the cerebral
white matter are nonspecific but compatible with mild-to-moderate
chronic small vessel ischemic disease.

There is a punctate 2 mm enhancing lesion in the superior left
cerebellum (series 12, image 24). A similar 2 mm enhancing lesion is
also present in the inferior right cerebellum (series 12, image 14).
There is a 4 mm enhancing lesion in the left superior frontal gyrus
(series 12, image 44). There is no significant edema associated with
any of these lesions.

Orbits are unremarkable. No significant inflammatory change is seen
in the paranasal sinuses are mastoid air cells. Major intracranial
vascular flow voids are preserved.
IMPRESSION: 1. 3 subcentimeter metastases in the left frontal lobe and
cerebellum. No edema.
2. Cervical spine osseous metastases.
3. Mild-to-moderate chronic small vessel ischemic disease.
